# Patient Record
Sex: Female | Born: 1945 | Hispanic: No | Smoking: Never smoker
Health system: Southern US, Community
[De-identification: ages and names within clinical notes are randomized; demographics above are authoritative.]

## PROBLEM LIST (undated history)

## (undated) DIAGNOSIS — F4320 Adjustment disorder, unspecified: Secondary | ICD-10-CM

## (undated) DIAGNOSIS — I10 Essential (primary) hypertension: Secondary | ICD-10-CM

## (undated) DIAGNOSIS — I4891 Unspecified atrial fibrillation: Secondary | ICD-10-CM

## (undated) DIAGNOSIS — H8110 Benign paroxysmal vertigo, unspecified ear: Secondary | ICD-10-CM

## (undated) DIAGNOSIS — R002 Palpitations: Secondary | ICD-10-CM

## (undated) HISTORY — PX: PARTIAL HYSTERECTOMY: SHX80

## (undated) HISTORY — DX: Essential (primary) hypertension: I10

## (undated) HISTORY — DX: Unspecified atrial fibrillation: I48.91

## (undated) HISTORY — DX: Benign paroxysmal vertigo, unspecified ear: H81.10

## (undated) HISTORY — PX: GALLBLADDER SURGERY: SHX652

## (undated) HISTORY — DX: Adjustment disorder, unspecified: F43.20

## (undated) HISTORY — DX: Palpitations: R00.2

## (undated) HISTORY — PX: COLONOSCOPY: SHX174

---

## 2015-02-27 DIAGNOSIS — M810 Age-related osteoporosis without current pathological fracture: Secondary | ICD-10-CM | POA: Insufficient documentation

## 2015-02-27 HISTORY — DX: Age-related osteoporosis without current pathological fracture: M81.0

## 2016-12-13 DIAGNOSIS — R7301 Impaired fasting glucose: Secondary | ICD-10-CM

## 2016-12-13 DIAGNOSIS — E559 Vitamin D deficiency, unspecified: Secondary | ICD-10-CM | POA: Insufficient documentation

## 2016-12-13 DIAGNOSIS — Z79890 Hormone replacement therapy (postmenopausal): Secondary | ICD-10-CM

## 2016-12-13 DIAGNOSIS — M47812 Spondylosis without myelopathy or radiculopathy, cervical region: Secondary | ICD-10-CM

## 2016-12-13 DIAGNOSIS — M47816 Spondylosis without myelopathy or radiculopathy, lumbar region: Secondary | ICD-10-CM | POA: Insufficient documentation

## 2016-12-13 HISTORY — DX: Vitamin D deficiency, unspecified: E55.9

## 2016-12-13 HISTORY — DX: Impaired fasting glucose: R73.01

## 2016-12-13 HISTORY — DX: Spondylosis without myelopathy or radiculopathy, lumbar region: M47.812

## 2016-12-13 HISTORY — DX: Hormone replacement therapy: Z79.890

## 2017-06-03 DIAGNOSIS — N63 Unspecified lump in unspecified breast: Secondary | ICD-10-CM

## 2017-06-03 HISTORY — DX: Unspecified lump in unspecified breast: N63.0

## 2018-02-03 DIAGNOSIS — R030 Elevated blood-pressure reading, without diagnosis of hypertension: Secondary | ICD-10-CM

## 2018-02-03 DIAGNOSIS — R002 Palpitations: Secondary | ICD-10-CM | POA: Insufficient documentation

## 2018-02-03 DIAGNOSIS — I498 Other specified cardiac arrhythmias: Secondary | ICD-10-CM | POA: Insufficient documentation

## 2018-02-03 HISTORY — DX: Elevated blood-pressure reading, without diagnosis of hypertension: R03.0

## 2018-02-03 HISTORY — DX: Other specified cardiac arrhythmias: I49.8

## 2018-02-22 DIAGNOSIS — I4719 Other supraventricular tachycardia: Secondary | ICD-10-CM

## 2018-02-22 DIAGNOSIS — I471 Supraventricular tachycardia: Secondary | ICD-10-CM | POA: Insufficient documentation

## 2018-02-22 HISTORY — DX: Other supraventricular tachycardia: I47.19

## 2019-01-23 DIAGNOSIS — K219 Gastro-esophageal reflux disease without esophagitis: Secondary | ICD-10-CM | POA: Insufficient documentation

## 2019-01-23 HISTORY — DX: Gastro-esophageal reflux disease without esophagitis: K21.9

## 2019-11-19 DIAGNOSIS — I10 Essential (primary) hypertension: Secondary | ICD-10-CM

## 2019-11-19 DIAGNOSIS — R251 Tremor, unspecified: Secondary | ICD-10-CM

## 2019-11-19 HISTORY — DX: Tremor, unspecified: R25.1

## 2019-11-19 HISTORY — DX: Essential (primary) hypertension: I10

## 2020-08-12 NOTE — Progress Notes (Signed)
Cardiology Office Note:    Date:  08/13/2020   ID:  Debra Mendez, DOB [birth year], MRN [id number]  PCP:  [place], Sarah T, MD  Cardiologist:  Norman Herrlich, MD   Referring MD: [place], Sarah T, MD  ASSESSMENT:    1. Chest pain of uncertain etiology   2. Paroxysmal atrial tachycardia (HCC)   3. Atrial premature beats   4. Essential hypertension   5. Shortened PR interval    PLAN:    In order of problems listed above:  1. Her chest pain is concerning I would classify it is possibly cardiac and she is at increased cardiovascular risk with age and hypertension.  I am surprised that this is esophageal when she is taking a PPI not clearly related to her meals and different than her typical reflux.  We discussed modalities for evaluation and after reviewing stress modalities versus cardiac CTA will go ahead and schedule an outpatient cardiac CTA to be performed.  He has no dye intolerance or kidney disease.  If high risk markers would benefit from coronary angiography and revascularization.  If calcium score is elevated would benefit from statin therapy 2. Stable she has had no recurrent atrial tachycardia at this time we will hold on suppressant therapy she has not tolerated calcium channel blockers beta-blockers or antiarrhythmic drugs 3. Stable well-controlled with nonrate limiting calcium channel blocker 4. She has accelerated AV nodal conduction without documented SVT or WPW.  Next appointment 6 weeks   Medication Adjustments/Labs and Tests Ordered: Current medicines are reviewed at length with the patient today.  Concerns regarding medicines are outlined above.  Orders Placed This Encounter  Procedures  . CT CORONARY MORPH W/CTA COR W/SCORE W/CA W/CM &/OR WO/CM  . CT CORONARY FRACTIONAL FLOW RESERVE DATA PREP  . CT CORONARY FRACTIONAL FLOW RESERVE FLUID ANALYSIS  . Basic metabolic panel  . EKG 12-Lead   Meds ordered this encounter  Medications  . metoprolol tartrate  (LOPRESSOR) 100 MG tablet    Sig: Take 1 tablet (100 mg total) by mouth once for 1 dose. Take two hours prior to your cardiac CT    Dispense:  1 tablet    Refill:  0     Chief Complaint  Patient presents with  . Tachycardia    History of PAT  She is mostly concerned about the chest discomfort  History of Present Illness:    Debra Mendez is a 74 y.o. female who is being seen today for the evaluation of palpitation at the request of [place], Sarah T, MD.  Chart review: Cerebrovascular duplex performed at [facility] 04/11/2020 showed minimal atherosclerosis in the left carotid bulb otherwise normal no stenosis. Record review primary care physician office note 06/09/2020 relates she has a history of palpitation and paroxysmal atrial tachycardia as well as vertigo and hypertension. An EKG from 01/19/2018 independently reviewed sinus rhythm with frequent APCs. She has been seen at [facility] practice Dr. Berkley Harvey with a history of paroxysmal atrial tachycardia Laboratory studies from 2018 showed a cholesterol 245 triglycerides 123 HDL 93 12/12/2019 potassium 3.5 creatinine 0.72 hemoglobin 12.7 She had an echocardiogram performed 07/09/2019 which showed normal left ventricular size global and regional left ventricular systolic function EF 60 to 65% normal diastolic function and mild concentric LVH.  There is no significant valvular abnormality. She saw Dr. Clydie Braun, EP [facility] 08/07/2018's note relates that she had short runs of atrial tachycardia and frequent APCs trial of diltiazem was not  tolerated and Coreg was not tolerated with weakness and hypotension.  She was treated with low-dose flecainide.  She is seen here today at her request to establish cardiology care She has done better with her arrhythmia very little palpitation not severe sustained and tells me she was intolerant of flecainide with weakness. The past Benadryl provoked her episodes  and she avoids it. He had trouble with elevated blood pressures on amlodipine and is well controlled. She relates that she has been experiencing chest pain a different from her typical reflux.  She takes a PPI especially in the early mornings when she is stressed she gets burning through the chest no radiation to the back or shoulders no shortness of breath it is not pleuritic she is not diaphoretic at times it forces her to stop sit down and rest for relief but is not exertional in nature.  She is concerned about CAD.  I would describe the symptoms as possible cardiac. Is no association of her chest pain with meals. She has no history of congenital rheumatic heart disease.  Past Medical History:  Diagnosis Date  . Adjustment disorder   . Benign paroxysmal vertigo, unspecified ear   . Essential (primary) hypertension   . Palpitations     Past Surgical History:  Procedure Laterality Date  . COLONOSCOPY    . GALLBLADDER SURGERY    . PARTIAL HYSTERECTOMY      Current Medications: Current Meds  Medication Sig  . amLODipine (NORVASC) 5 MG tablet Take 5 mg by mouth daily.  . Cholecalciferol (VITAMIN D3) 50 MCG (2000 UT) TABS Take 1 tablet by mouth daily.  Marland Kitchen estradiol (ESTRACE) 0.5 MG tablet Take 0.5 mg by mouth daily.  Marland Kitchen ibuprofen (ADVIL) 200 MG tablet Take 200 mg by mouth every 8 (eight) hours as needed.  . pantoprazole (PROTONIX) 40 MG tablet Take 40 mg by mouth daily as needed.  . [DISCONTINUED] hydrochlorothiazide (HYDRODIURIL) 12.5 MG tablet Take 12.5 mg by mouth daily.  . [DISCONTINUED] meclizine (ANTIVERT) 25 MG tablet Take 25 mg by mouth every 8 (eight) hours as needed.     Allergies:   Patient has no known allergies.   Social History   Socioeconomic History  . Marital status: Unknown    Spouse name: Not on file  . Number of children: Not on file  . Years of education: Not on file  . Highest education level: Not on file  Occupational History  . Not on file  Tobacco Use    . Smoking status: Never Smoker  . Smokeless tobacco: Never Used  Vaping Use  . Vaping Use: Never used  Substance and Sexual Activity  . Alcohol use: Not Currently  . Drug use: Never  . Sexual activity: Not on file  Other Topics Concern  . Not on file  Social History Narrative  . Not on file   Social Determinants of Health   Financial Resource Strain:   . Difficulty of Paying Living Expenses: Not on file  Food Insecurity:   . Worried About Programme researcher, broadcasting/film/video in the Last Year: Not on file  . Ran Out of Food in the Last Year: Not on file  Transportation Needs:   . Lack of Transportation (Medical): Not on file  . Lack of Transportation (Non-Medical): Not on file  Physical Activity:   . Days of Exercise per Week: Not on file  . Minutes of Exercise per Session: Not on file  Stress:   . Feeling of Stress : Not  on file  Social Connections:   . Frequency of Communication with Friends and Family: Not on file  . Frequency of Social Gatherings with Friends and Family: Not on file  . Attends Religious Services: Not on file  . Active Member of Clubs or Organizations: Not on file  . Attends Banker Meetings: Not on file  . Marital Status: Not on file     Family History: The patient's family history includes Dementia in her mother; Diabetes in her sister; Heart attack in her maternal grandfather; Heart disease in her father; Stroke in her maternal grandmother and mother.  ROS:   ROS Please see the history of present illness.     All other systems reviewed and are negative.  EKGs/Labs/Other Studies Reviewed:    The following studies were reviewed today:   EKG:  EKG is  ordered today.  The ekg ordered today is personally reviewed and demonstrates normal QRS duration with accelerated AV nodal conduction PR interval 102 ms she has no delta wave this is not WPW    Physical Exam:    VS:  BP 136/70   Pulse (!) 59   Ht 5\' 2"  (1.575 m)   Wt 104 lb 6.4 oz (47.4 kg)    SpO2 99%   BMI 19.10 kg/m     Wt Readings from Last 3 Encounters:  08/13/20 104 lb 6.4 oz (47.4 kg)     GEN:  Well nourished, well developed in no acute distress HEENT: Normal NECK: No JVD; No carotid bruits LYMPHATICS: No lymphadenopathy CARDIAC: RRR, no murmurs, rubs, gallops RESPIRATORY:  Clear to auscultation without rales, wheezing or rhonchi  ABDOMEN: Soft, non-tender, non-distended MUSCULOSKELETAL:  No edema; No deformity  SKIN: Warm and dry NEUROLOGIC:  Alert and oriented x 3 PSYCHIATRIC:  Normal affect     Signed, 08/15/20, MD  08/13/2020 4:08 PM    [facility]

## 2020-08-13 ENCOUNTER — Other Ambulatory Visit: Payer: Self-pay

## 2020-08-13 ENCOUNTER — Encounter: Payer: Self-pay | Admitting: Cardiology

## 2020-08-13 ENCOUNTER — Ambulatory Visit: Payer: Medicare HMO | Admitting: Cardiology

## 2020-08-13 VITALS — BP 136/70 | HR 59 | Ht 62.0 in | Wt 104.4 lb

## 2020-08-13 DIAGNOSIS — I471 Supraventricular tachycardia: Secondary | ICD-10-CM

## 2020-08-13 DIAGNOSIS — R079 Chest pain, unspecified: Secondary | ICD-10-CM

## 2020-08-13 DIAGNOSIS — I10 Essential (primary) hypertension: Secondary | ICD-10-CM | POA: Diagnosis not present

## 2020-08-13 DIAGNOSIS — I491 Atrial premature depolarization: Secondary | ICD-10-CM | POA: Diagnosis not present

## 2020-08-13 DIAGNOSIS — R9431 Abnormal electrocardiogram [ECG] [EKG]: Secondary | ICD-10-CM

## 2020-08-13 MED ORDER — METOPROLOL TARTRATE 100 MG PO TABS: 100.0000 mg | ORAL_TABLET | Freq: Once | ORAL | 0 refills | Status: DC

## 2020-08-13 NOTE — Patient Instructions (Addendum)
Medication Instructions:  Your physician recommends that you continue on your current medications as directed. Please refer to the Current Medication list given to you today.  *If you need a refill on your cardiac medications before your next appointment, please call your pharmacy*   Lab Work: None If you have labs (blood work) drawn today and your tests are completely normal, you will receive your results only by: Marland Kitchen MyChart Message (if you have MyChart) OR . A paper copy in the mail If you have any lab test that is abnormal or we need to change your treatment, we will call you to review the results.   Testing/Procedures: Your cardiac CT will be scheduled at the below location:   [facility] [address] [place],  [address] [phone]  If scheduled at [facility], please arrive at the [facility] main entrance of [facility] 30 minutes prior to test start time. Proceed to the [facility] Radiology Department (first floor) to check-in and test prep.   Please follow these instructions carefully (unless otherwise directed):   On the Night Before the Test: . Be sure to Drink plenty of water. . Do not consume any caffeinated/decaffeinated beverages or chocolate 12 hours prior to your test. . Do not take any antihistamines 12 hours prior to your test.   On the Day of the Test: . Drink plenty of water. Do not drink any water within one hour of the test. . Do not eat any food 4 hours prior to the test. . You may take your regular medications prior to the test.  . Take metoprolol (Lopressor) two hours prior to test. . FEMALES- please wear underwire-free bra if available      After the Test: . Drink plenty of water. . After receiving IV contrast, you may experience a mild flushed feeling. This is normal. . On occasion, you may experience a mild rash up to 24 hours after the test. This is not dangerous. If this occurs, you can take Benadryl 25  mg and increase your fluid intake. . If you experience trouble breathing, this can be serious. If it is severe call 911 IMMEDIATELY. If it is mild, please call our office. . If you take any of these medications: Glipizide/Metformin, Avandament, Glucavance, please do not take 48 hours after completing test unless otherwise instructed.   Once we have confirmed authorization from your insurance company, we will call you to set up a date and time for your test. Based on how quickly your insurance processes prior authorizations requests, please allow up to 4 weeks to be contacted for scheduling your Cardiac CT appointment. Be advised that routine Cardiac CT appointments could be scheduled as many as 8 weeks after your provider has ordered it.  For non-scheduling related questions, please contact the cardiac imaging nurse navigator should you have any questions/concerns: Marchia Bond, Cardiac Imaging Nurse Navigator Burley Saver, Interim Cardiac Imaging Nurse [facility] Direct Office Dial: [phone]   For scheduling needs, including cancellations and rescheduling, please call [place], [phone] (temporary number).      Follow-Up: At [facility], you and your health needs are our priority.  As part of our continuing mission to provide you with exceptional heart care, we have created designated Provider Care Teams.  These Care Teams include your primary Cardiologist (physician) and Advanced Practice Providers (APPs -  Physician Assistants and Nurse Practitioners) who all work together to provide you with the care  you need, when you need it.  We recommend signing up for the patient portal called "MyChart".  Sign up information is provided on this After Visit Summary.  MyChart is used to connect with patients for Virtual Visits (Telemedicine).  Patients are able to view lab/test results, encounter notes, upcoming appointments, etc.  Non-urgent messages can be  sent to your provider as well.   To learn more about what you can do with MyChart, go to [email or web address].    Your next appointment:   6 week(s)  The format for your next appointment:   In Person  Provider:   Shirlee More, MD   Other Instructions

## 2020-09-09 ENCOUNTER — Telehealth: Payer: Self-pay | Admitting: Cardiology

## 2020-09-09 NOTE — Telephone Encounter (Signed)
New Message  Pt would like to have her CT done at the [facility] [place] because she works there   Please advise

## 2020-09-09 NOTE — Telephone Encounter (Signed)
Spoke with pt, aware the cardiac CT scan is only done at [facility].

## 2020-09-11 ENCOUNTER — Other Ambulatory Visit: Payer: Self-pay

## 2020-09-11 DIAGNOSIS — R079 Chest pain, unspecified: Secondary | ICD-10-CM

## 2020-09-12 LAB — BASIC METABOLIC PANEL
BUN/Creatinine Ratio: 16 (ref 12–28)
BUN: 12 mg/dL (ref 8–27)
CO2: 23 mmol/L (ref 20–29)
Calcium: 9.6 mg/dL (ref 8.7–10.3)
Chloride: 101 mmol/L (ref 96–106)
Creatinine, Ser: 0.74 mg/dL (ref 0.57–1.00)
GFR calc Af Amer: 93 mL/min/{1.73_m2} (ref >59)
GFR calc non Af Amer: 81 mL/min/{1.73_m2} (ref >59)
Glucose: 114 mg/dL — ABNORMAL HIGH (ref 65–99)
Potassium: 4 mmol/L (ref 3.5–5.2)
Sodium: 138 mmol/L (ref 134–144)

## 2020-09-15 ENCOUNTER — Telehealth: Payer: Self-pay

## 2020-09-15 NOTE — Telephone Encounter (Signed)
Spoke with patient regarding results and recommendation.  Patient verbalizes understanding and is agreeable to plan of care. Advised patient to call back with any issues or concerns.  

## 2020-09-18 ENCOUNTER — Telehealth (HOSPITAL_COMMUNITY): Payer: Self-pay | Admitting: Emergency Medicine

## 2020-09-18 NOTE — Telephone Encounter (Signed)
Attempted to call patient regarding upcoming cardiac CT appointment. °Left message on voicemail with name and callback number °Traniya Prichett RN Navigator Cardiac Imaging °[facility] °[phone] Office °[phone] Cell ° °

## 2020-09-18 NOTE — Telephone Encounter (Signed)
Pt returning phone call regarding upcoming cardiac imaging study; pt verbalizes understanding of appt date/time, parking situation and where to check in, pre-test NPO status and medications ordered, and verified current allergies; name and call back number provided for further questions should they arise Eleana Tocco RN Navigator Cardiac Imaging [facility] [phone] office [phone] cell   

## 2020-09-22 ENCOUNTER — Ambulatory Visit (HOSPITAL_COMMUNITY): Payer: Medicare HMO

## 2020-09-23 DIAGNOSIS — H40033 Anatomical narrow angle, bilateral: Secondary | ICD-10-CM | POA: Diagnosis not present

## 2020-09-23 DIAGNOSIS — H2512 Age-related nuclear cataract, left eye: Secondary | ICD-10-CM | POA: Diagnosis not present

## 2020-09-23 DIAGNOSIS — Z01818 Encounter for other preprocedural examination: Secondary | ICD-10-CM | POA: Diagnosis not present

## 2020-09-29 ENCOUNTER — Telehealth (HOSPITAL_COMMUNITY): Payer: Self-pay | Admitting: Emergency Medicine

## 2020-09-29 NOTE — Telephone Encounter (Signed)
Reaching out to patient to offer assistance regarding upcoming cardiac imaging study; pt verbalizes understanding of appt date/time, parking situation and where to check in, pre-test NPO status and medications ordered, and verified current allergies; name and call back number provided for further questions should they arise Rockwell Alexandria RN Navigator Cardiac Imaging [facility] [phone] office [phone] cell  Pt to take 50mg  metop 2 hr prior to scan 

## 2020-10-01 ENCOUNTER — Ambulatory Visit (HOSPITAL_COMMUNITY)
Admission: RE | Admit: 2020-10-01 | Discharge: 2020-10-01 | Disposition: A | Discharge status: Home / Self Care | Payer: Medicare HMO | Source: Ambulatory Visit | Attending: Cardiology | Admitting: Cardiology

## 2020-10-01 ENCOUNTER — Encounter: Payer: Medicare HMO | Admitting: *Deleted

## 2020-10-01 ENCOUNTER — Telehealth: Payer: Self-pay | Admitting: Cardiology

## 2020-10-01 ENCOUNTER — Other Ambulatory Visit: Payer: Self-pay

## 2020-10-01 ENCOUNTER — Encounter (HOSPITAL_COMMUNITY): Payer: Self-pay

## 2020-10-01 DIAGNOSIS — I700 Atherosclerosis of aorta: Secondary | ICD-10-CM | POA: Insufficient documentation

## 2020-10-01 DIAGNOSIS — Q211 Atrial septal defect: Secondary | ICD-10-CM | POA: Diagnosis not present

## 2020-10-01 DIAGNOSIS — Z006 Encounter for examination for normal comparison and control in clinical research program: Secondary | ICD-10-CM

## 2020-10-01 DIAGNOSIS — R079 Chest pain, unspecified: Secondary | ICD-10-CM

## 2020-10-01 DIAGNOSIS — I2510 Atherosclerotic heart disease of native coronary artery without angina pectoris: Secondary | ICD-10-CM | POA: Diagnosis not present

## 2020-10-01 DIAGNOSIS — I471 Supraventricular tachycardia: Secondary | ICD-10-CM

## 2020-10-01 MED ORDER — NITROGLYCERIN 0.4 MG SL SUBL
0.8000 mg | SUBLINGUAL_TABLET | Freq: Once | SUBLINGUAL | Status: AC
  Administered 2020-10-01: 0.8 mg via SUBLINGUAL

## 2020-10-01 MED ORDER — NITROGLYCERIN 0.4 MG SL SUBL
SUBLINGUAL_TABLET | SUBLINGUAL | Status: AC
  Filled 2020-10-01: qty 2

## 2020-10-01 MED ORDER — IOHEXOL 350 MG/ML SOLN
80.0000 mL | Freq: Once | INTRAVENOUS | Status: AC | PRN
  Administered 2020-10-01: 80 mL via INTRAVENOUS

## 2020-10-01 NOTE — Research (Signed)
CADFEM G4 Informed Consent   Subject Name: Debra Mendez  Subject met inclusion and exclusion criteria.  The informed consent form, study requirements and expectations were reviewed with the subject and questions and concerns were addressed prior to the signing of the consent form.  The subject verbalized understanding of the trial requirements.  The subject agreed to participate in the CADFEM G4 trial and signed the informed consent at 10:30 on 10/01/2020.  The informed consent was obtained prior to performance of any protocol-specific procedures for the subject.  A copy of the signed informed consent was given to the subject and a copy was placed in the subject's medical record.   Philemon Kingdom D

## 2020-10-01 NOTE — Telephone Encounter (Signed)
Patient is calling back for results. Please call back

## 2020-10-01 NOTE — Telephone Encounter (Signed)
Called patient informed her of results.  

## 2020-10-06 ENCOUNTER — Ambulatory Visit: Payer: Medicare HMO | Admitting: Cardiology

## 2020-10-14 DIAGNOSIS — H2512 Age-related nuclear cataract, left eye: Secondary | ICD-10-CM | POA: Diagnosis not present

## 2020-10-14 DIAGNOSIS — H259 Unspecified age-related cataract: Secondary | ICD-10-CM | POA: Diagnosis not present

## 2020-10-14 DIAGNOSIS — I10 Essential (primary) hypertension: Secondary | ICD-10-CM | POA: Diagnosis not present

## 2020-10-14 DIAGNOSIS — K219 Gastro-esophageal reflux disease without esophagitis: Secondary | ICD-10-CM | POA: Diagnosis not present

## 2020-10-14 DIAGNOSIS — H52203 Unspecified astigmatism, bilateral: Secondary | ICD-10-CM | POA: Diagnosis not present

## 2020-10-14 DIAGNOSIS — H35371 Puckering of macula, right eye: Secondary | ICD-10-CM | POA: Diagnosis not present

## 2020-10-14 DIAGNOSIS — Z79899 Other long term (current) drug therapy: Secondary | ICD-10-CM | POA: Diagnosis not present

## 2020-10-14 DIAGNOSIS — H40033 Anatomical narrow angle, bilateral: Secondary | ICD-10-CM | POA: Diagnosis not present

## 2020-11-03 ENCOUNTER — Ambulatory Visit: Payer: Medicare HMO | Admitting: Cardiology

## 2020-12-09 DIAGNOSIS — R197 Diarrhea, unspecified: Secondary | ICD-10-CM | POA: Diagnosis not present

## 2020-12-09 DIAGNOSIS — R42 Dizziness and giddiness: Secondary | ICD-10-CM | POA: Diagnosis not present

## 2020-12-11 DIAGNOSIS — R197 Diarrhea, unspecified: Secondary | ICD-10-CM | POA: Diagnosis not present

## 2020-12-24 NOTE — Progress Notes (Signed)
Cardiology Office Note:    Date:  12/25/2020   ID:  Debra Mendez, DOB January 07, 1946, MRN [id number]  PCP:  [place], Sarah T, MD  Cardiologist:  Norman Herrlich, MD    Referring MD: [place], Sarah T, MD    ASSESSMENT:    1. Chest pain of uncertain etiology   2. Paroxysmal atrial tachycardia (HCC)   3. Essential hypertension    PLAN:    In order of problems listed above:  1. Best description of her cardiac CTA is minimally abnormal very near normal.  Calcium score of 0 in a nondiabetic puts her extremely low risk for the next 5 to 7 years of cardiovascular events and makes lipid-lowering therapy optional.  Her patent foramen ovale is not a clinical concern.  I have offered her reassurance. 2. Stable at target on current treatment   Next appointment: As needed   Medication Adjustments/Labs and Tests Ordered: Current medicines are reviewed at length with the patient today.  Concerns regarding medicines are outlined above.  No orders of the defined types were placed in this encounter.  No orders of the defined types were placed in this encounter.   No chief complaint on file.   History of Present Illness:    Debra Mendez is a 75 y.o. female with a hx of  paroxysmal atrial tachycardia hypertension and a short PR interval without preexcitation.  Last seen 24 2021 Compliance with diet, lifestyle and medications: Yes  Her primary complaint is abdominal discomfort and watery and frequent diarrhea.  It makes her feel weak.  No complaints of chest pain shortness of breath palpitation or syncope.  She had cardiac CTA reported out 10/01/2020 with a calcium score of 0 and minimal nonobstructive plaque less than 25% noncalcified noted in the LAD and left circumflex coronary artery.  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 Adjustment disorder   . Benign paroxysmal vertigo, unspecified ear   . Essential  (primary) hypertension   . Palpitations     Past Surgical History:  Procedure Laterality Date  . COLONOSCOPY    . GALLBLADDER SURGERY    . PARTIAL HYSTERECTOMY      Current Medications: Current Meds  Medication Sig  . Cholecalciferol (VITAMIN D3) 50 MCG (2000 UT) TABS Take 1 tablet by mouth daily.  Marland Kitchen estradiol (ESTRACE) 0.5 MG tablet Take 0.5 mg by mouth daily.  Marland Kitchen ibuprofen (ADVIL) 200 MG tablet Take 200 mg by mouth every 8 (eight) hours as needed.  . pantoprazole (PROTONIX) 40 MG tablet Take 40 mg by mouth daily as needed.  . [DISCONTINUED] amLODipine (NORVASC) 5 MG tablet Take 5 mg by mouth daily.     Allergies:   Patient has no known allergies.   Social History   Socioeconomic History  . Marital status: Unknown    Spouse name: Not on file  . Number of children: Not on file  . Years of education: Not on file  . Highest education level: Not on file  Occupational History  . Not on file  Tobacco Use  . Smoking status: Never Smoker  . Smokeless tobacco: Never Used  Vaping Use  . Vaping Use: Never used  Substance and Sexual Activity  . Alcohol use: Not Currently  . Drug use: Never  . Sexual activity: Not on file  Other Topics Concern  . Not on file  Social History Narrative  . Not on file  Social Determinants of Health   Financial Resource Strain: Not on file  Food Insecurity: Not on file  Transportation Needs: Not on file  Physical Activity: Not on file  Stress: Not on file  Social Connections: Not on file     Family History: The patient's family history includes Dementia in her mother; Diabetes in her sister; Heart attack in her maternal grandfather; Heart disease in her father; Stroke in her maternal grandmother and mother. ROS:   Please see the history of present illness.    All other systems reviewed and are negative.  EKGs/Labs/Other Studies Reviewed:    The following studies were reviewed today:    Recent Labs: 09/11/2020: BUN 12; Creatinine,  Ser 0.74; Potassium 4.0; Sodium 138  Recent Lipid Panel No results found for: CHOL, TRIG, HDL, CHOLHDL, VLDL, LDLCALC, LDLDIRECT  Physical Exam:    VS:  BP (!) 114/54 (BP Location: Left Arm, Patient Position: Sitting)   Pulse 72   Ht 5\' 2"  (1.575 m)   Wt 104 lb 9.6 oz (47.4 kg)   SpO2 97%   BMI 19.13 kg/m     Wt Readings from Last 3 Encounters:  12/25/20 104 lb 9.6 oz (47.4 kg)  08/13/20 104 lb 6.4 oz (47.4 kg)     GEN:  Well nourished, well developed in no acute distress HEENT: Normal NECK: No JVD; No carotid bruits LYMPHATICS: No lymphadenopathy CARDIAC: RRR, no murmurs, rubs, gallops RESPIRATORY:  Clear to auscultation without rales, wheezing or rhonchi  ABDOMEN: Soft, non-tender, non-distended MUSCULOSKELETAL:  No edema; No deformity  SKIN: Warm and dry NEUROLOGIC:  Alert and oriented x 3 PSYCHIATRIC:  Normal affect    Signed, 08/15/20, MD  12/25/2020 1:40 PM    [facility]

## 2020-12-25 ENCOUNTER — Other Ambulatory Visit: Payer: Self-pay

## 2020-12-25 ENCOUNTER — Ambulatory Visit: Payer: Medicare HMO | Admitting: Cardiology

## 2020-12-25 ENCOUNTER — Encounter: Payer: Self-pay | Admitting: Cardiology

## 2020-12-25 VITALS — BP 114/54 | HR 72 | Ht 62.0 in | Wt 104.6 lb

## 2020-12-25 DIAGNOSIS — I10 Essential (primary) hypertension: Secondary | ICD-10-CM | POA: Diagnosis not present

## 2020-12-25 DIAGNOSIS — R079 Chest pain, unspecified: Secondary | ICD-10-CM | POA: Diagnosis not present

## 2020-12-25 MED ORDER — AMLODIPINE BESYLATE 5 MG PO TABS: 5.0000 mg | ORAL_TABLET | Freq: Every day | ORAL | 3 refills | Status: DC

## 2020-12-25 NOTE — Patient Instructions (Signed)

## 2020-12-25 NOTE — Telephone Encounter (Signed)
Amlodipine 5 mg tablet # 90 x 3 additional refill sent to pharmacy.

## 2021-02-02 DIAGNOSIS — K581 Irritable bowel syndrome with constipation: Secondary | ICD-10-CM | POA: Diagnosis not present

## 2021-02-10 ENCOUNTER — Telehealth: Payer: Self-pay

## 2021-02-10 DIAGNOSIS — Z006 Encounter for examination for normal comparison and control in clinical research program: Secondary | ICD-10-CM

## 2021-02-10 NOTE — Telephone Encounter (Signed)
I called patient for her 90-day Identify Study follow up phone call. Patient is doing well with no cardiac symptoms at this time. I reminded patient I would call her in January for her 1 year follow-up. 

## 2021-03-18 DIAGNOSIS — K581 Irritable bowel syndrome with constipation: Secondary | ICD-10-CM | POA: Diagnosis not present

## 2021-03-18 DIAGNOSIS — K5900 Constipation, unspecified: Secondary | ICD-10-CM | POA: Diagnosis not present

## 2021-03-18 DIAGNOSIS — K5730 Diverticulosis of large intestine without perforation or abscess without bleeding: Secondary | ICD-10-CM | POA: Diagnosis not present

## 2021-03-19 DIAGNOSIS — H6983 Other specified disorders of Eustachian tube, bilateral: Secondary | ICD-10-CM | POA: Diagnosis not present

## 2021-03-19 DIAGNOSIS — R197 Diarrhea, unspecified: Secondary | ICD-10-CM | POA: Diagnosis not present

## 2021-03-19 DIAGNOSIS — I10 Essential (primary) hypertension: Secondary | ICD-10-CM | POA: Diagnosis not present

## 2021-03-19 DIAGNOSIS — E559 Vitamin D deficiency, unspecified: Secondary | ICD-10-CM | POA: Diagnosis not present

## 2021-03-19 DIAGNOSIS — R222 Localized swelling, mass and lump, trunk: Secondary | ICD-10-CM | POA: Diagnosis not present

## 2021-03-24 DIAGNOSIS — Z13220 Encounter for screening for lipoid disorders: Secondary | ICD-10-CM | POA: Diagnosis not present

## 2021-03-24 DIAGNOSIS — E559 Vitamin D deficiency, unspecified: Secondary | ICD-10-CM | POA: Diagnosis not present

## 2021-03-24 DIAGNOSIS — I10 Essential (primary) hypertension: Secondary | ICD-10-CM | POA: Diagnosis not present

## 2021-04-21 DIAGNOSIS — I10 Essential (primary) hypertension: Secondary | ICD-10-CM | POA: Diagnosis not present

## 2021-04-21 DIAGNOSIS — D210 Benign neoplasm of connective and other soft tissue of head, face and neck: Secondary | ICD-10-CM | POA: Diagnosis not present

## 2021-05-13 DIAGNOSIS — Z780 Asymptomatic menopausal state: Secondary | ICD-10-CM | POA: Diagnosis not present

## 2021-05-13 DIAGNOSIS — Z1151 Encounter for screening for human papillomavirus (HPV): Secondary | ICD-10-CM | POA: Diagnosis not present

## 2021-05-13 DIAGNOSIS — Z01419 Encounter for gynecological examination (general) (routine) without abnormal findings: Secondary | ICD-10-CM | POA: Diagnosis not present

## 2021-05-13 DIAGNOSIS — M810 Age-related osteoporosis without current pathological fracture: Secondary | ICD-10-CM | POA: Diagnosis not present

## 2021-05-13 DIAGNOSIS — I10 Essential (primary) hypertension: Secondary | ICD-10-CM | POA: Diagnosis not present

## 2021-05-13 DIAGNOSIS — Z1272 Encounter for screening for malignant neoplasm of vagina: Secondary | ICD-10-CM | POA: Diagnosis not present

## 2021-05-13 DIAGNOSIS — Z79890 Hormone replacement therapy (postmenopausal): Secondary | ICD-10-CM | POA: Diagnosis not present

## 2021-05-13 DIAGNOSIS — Z90710 Acquired absence of both cervix and uterus: Secondary | ICD-10-CM | POA: Diagnosis not present

## 2021-06-09 DIAGNOSIS — H2511 Age-related nuclear cataract, right eye: Secondary | ICD-10-CM | POA: Diagnosis not present

## 2021-06-09 DIAGNOSIS — Z01818 Encounter for other preprocedural examination: Secondary | ICD-10-CM | POA: Diagnosis not present

## 2021-06-16 DIAGNOSIS — H259 Unspecified age-related cataract: Secondary | ICD-10-CM | POA: Diagnosis not present

## 2021-06-16 DIAGNOSIS — H2511 Age-related nuclear cataract, right eye: Secondary | ICD-10-CM | POA: Diagnosis not present

## 2021-06-16 DIAGNOSIS — H52203 Unspecified astigmatism, bilateral: Secondary | ICD-10-CM | POA: Diagnosis not present

## 2021-06-16 DIAGNOSIS — H25811 Combined forms of age-related cataract, right eye: Secondary | ICD-10-CM | POA: Diagnosis not present

## 2021-07-20 DIAGNOSIS — Z1231 Encounter for screening mammogram for malignant neoplasm of breast: Secondary | ICD-10-CM | POA: Diagnosis not present

## 2021-08-17 DIAGNOSIS — Z0100 Encounter for examination of eyes and vision without abnormal findings: Secondary | ICD-10-CM | POA: Diagnosis not present

## 2021-10-29 DIAGNOSIS — M810 Age-related osteoporosis without current pathological fracture: Secondary | ICD-10-CM | POA: Diagnosis not present

## 2021-10-29 DIAGNOSIS — M8589 Other specified disorders of bone density and structure, multiple sites: Secondary | ICD-10-CM | POA: Diagnosis not present

## 2021-10-29 DIAGNOSIS — Z79890 Hormone replacement therapy (postmenopausal): Secondary | ICD-10-CM | POA: Diagnosis not present

## 2021-12-28 ENCOUNTER — Other Ambulatory Visit: Payer: Self-pay | Admitting: Cardiology

## 2022-02-22 DIAGNOSIS — H81319 Aural vertigo, unspecified ear: Secondary | ICD-10-CM | POA: Diagnosis not present

## 2022-02-22 DIAGNOSIS — Z681 Body mass index (BMI) 19 or less, adult: Secondary | ICD-10-CM | POA: Diagnosis not present

## 2022-02-22 DIAGNOSIS — K219 Gastro-esophageal reflux disease without esophagitis: Secondary | ICD-10-CM | POA: Diagnosis not present

## 2022-02-22 DIAGNOSIS — Z1331 Encounter for screening for depression: Secondary | ICD-10-CM | POA: Diagnosis not present

## 2022-02-22 DIAGNOSIS — H6983 Other specified disorders of Eustachian tube, bilateral: Secondary | ICD-10-CM | POA: Diagnosis not present

## 2022-02-22 DIAGNOSIS — M2550 Pain in unspecified joint: Secondary | ICD-10-CM | POA: Diagnosis not present

## 2022-02-22 DIAGNOSIS — M8588 Other specified disorders of bone density and structure, other site: Secondary | ICD-10-CM | POA: Diagnosis not present

## 2022-02-22 DIAGNOSIS — I480 Paroxysmal atrial fibrillation: Secondary | ICD-10-CM | POA: Diagnosis not present

## 2022-02-22 DIAGNOSIS — Z7189 Other specified counseling: Secondary | ICD-10-CM | POA: Diagnosis not present

## 2022-02-22 DIAGNOSIS — I10 Essential (primary) hypertension: Secondary | ICD-10-CM | POA: Diagnosis not present

## 2022-02-22 DIAGNOSIS — M85852 Other specified disorders of bone density and structure, left thigh: Secondary | ICD-10-CM | POA: Diagnosis not present

## 2022-05-03 DIAGNOSIS — H8110 Benign paroxysmal vertigo, unspecified ear: Secondary | ICD-10-CM | POA: Diagnosis not present

## 2022-05-03 DIAGNOSIS — R42 Dizziness and giddiness: Secondary | ICD-10-CM | POA: Diagnosis not present

## 2022-05-03 DIAGNOSIS — H903 Sensorineural hearing loss, bilateral: Secondary | ICD-10-CM | POA: Diagnosis not present

## 2022-08-11 DIAGNOSIS — R059 Cough, unspecified: Secondary | ICD-10-CM | POA: Diagnosis not present

## 2022-08-11 DIAGNOSIS — J019 Acute sinusitis, unspecified: Secondary | ICD-10-CM | POA: Diagnosis not present

## 2022-08-11 DIAGNOSIS — J209 Acute bronchitis, unspecified: Secondary | ICD-10-CM | POA: Diagnosis not present

## 2022-08-16 DIAGNOSIS — J189 Pneumonia, unspecified organism: Secondary | ICD-10-CM | POA: Diagnosis not present

## 2022-08-16 DIAGNOSIS — H6992 Unspecified Eustachian tube disorder, left ear: Secondary | ICD-10-CM | POA: Diagnosis not present

## 2022-08-16 DIAGNOSIS — H81319 Aural vertigo, unspecified ear: Secondary | ICD-10-CM | POA: Diagnosis not present

## 2022-08-16 DIAGNOSIS — Z681 Body mass index (BMI) 19 or less, adult: Secondary | ICD-10-CM | POA: Diagnosis not present

## 2022-08-22 IMAGING — CT CT HEART MORP W/ CTA COR W/ SCORE W/ CA W/CM &/OR W/O CM
1 series · 13 of 18 positions shown, 17 images · non-contrast
Comparison: None.
COMPARISON: None.

Addendum:
EXAM:
OVER-READ INTERPRETATION  CT CHEST

The following report is an over-read performed by radiologist Dr.
Morena Da Costa Serfaty [REDACTED] on 10/01/2020. This
over-read does not include interpretation of cardiac or coronary
anatomy or pathology. The coronary calcium score/coronary CTA
interpretation by the cardiologist is attached.
CLINICAL DATA: Chest pain
Cardiac/Coronary CTA
TECHNIQUE: The patient was scanned on a [employer] scanner. A 110 kV
prospective scan was triggered in the descending thoracic aorta at
111 HU's. Axial non-contrast 3 mm slices were carried out through
the heart. The data set was analyzed on a dedicated work station and
scored using the Agatson method. Gantry rotation speed was 250 msecs
and collimation was .6 mm. No beta blockade and 0.8 mg of sl NTG was
given. The 3D data set was reconstructed in 5% intervals of the
35-75 % of the R-R cycle. Diastolic phases were analyzed on a
dedicated work station using MPR, MIP and VRT modes. The patient
received 80 cc of contrast.

[Series 659: findings · 13 of 18 slices shown, 17 images]
[im 2/18  vessel]
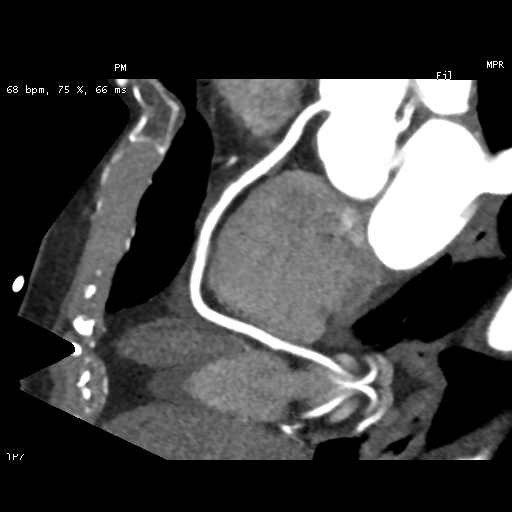
[im 2/18  lung]
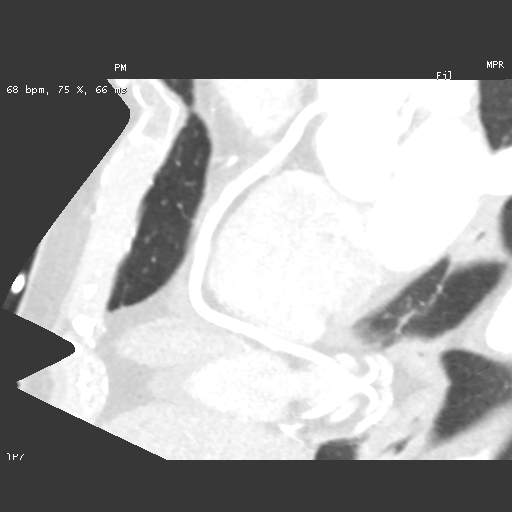
[im 3/18  vessel]
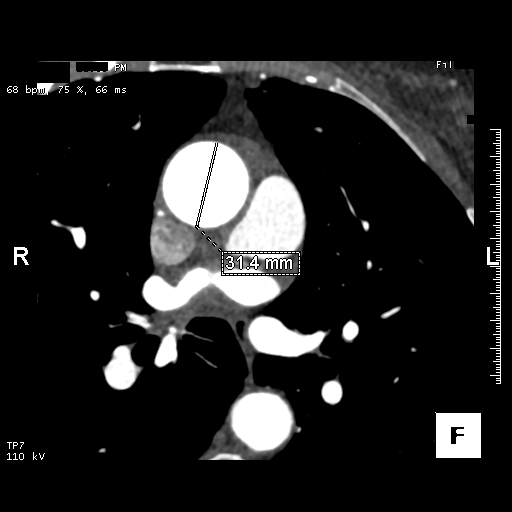
[im 5/18  vessel]
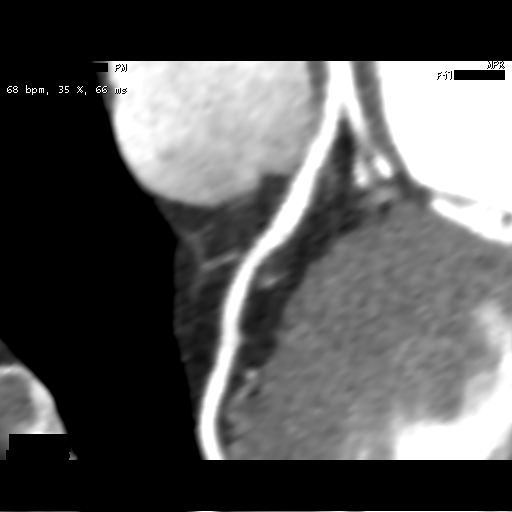
[im 6/18  vessel]
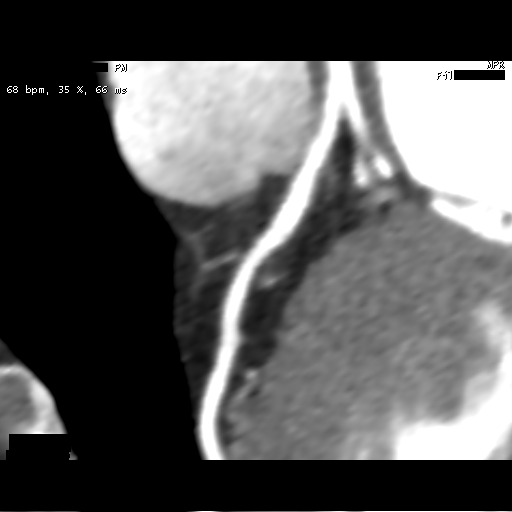
[im 7/18  vessel]
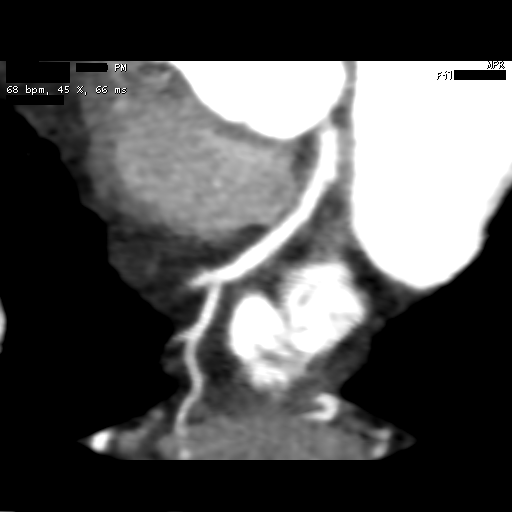
[im 7/18  lung]
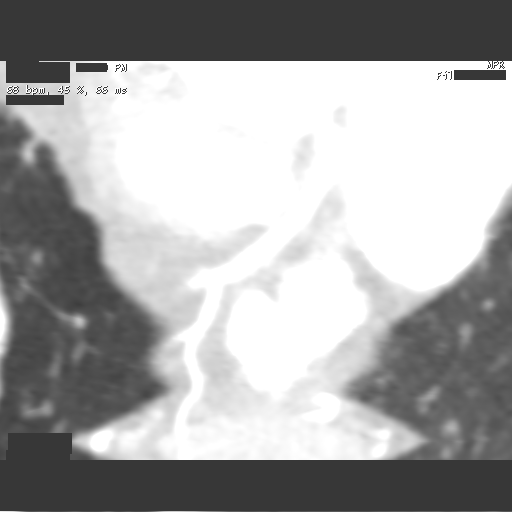
[im 8/18  vessel]
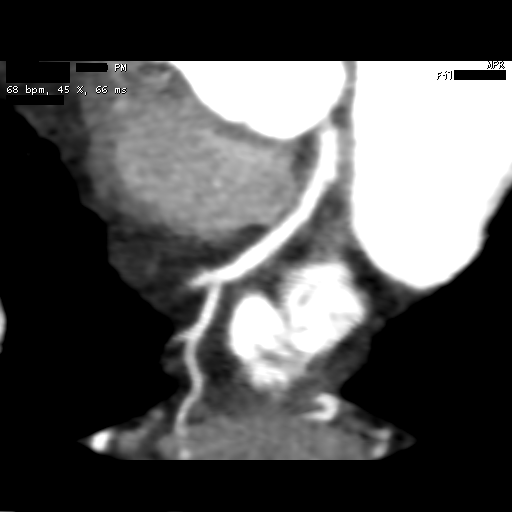
[im 10/18  vessel]
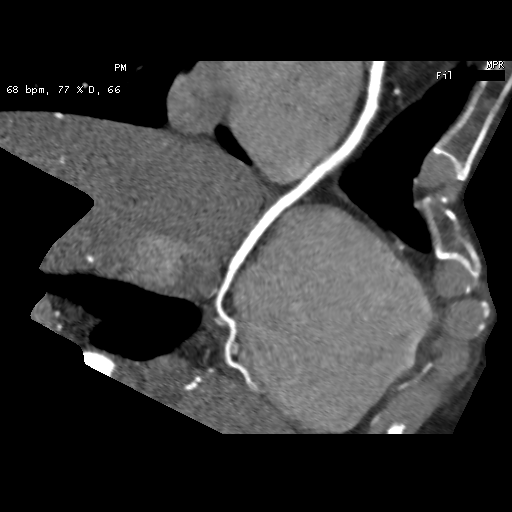
[im 11/18  vessel]
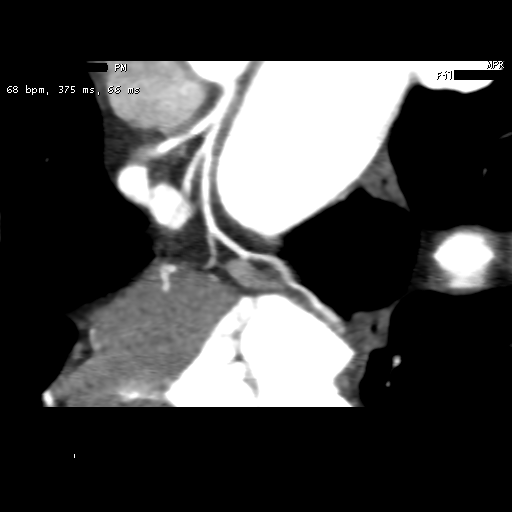
[im 12/18  vessel]
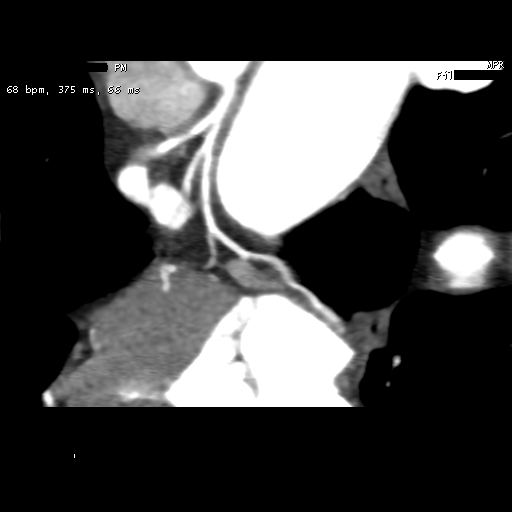
[im 12/18  lung]
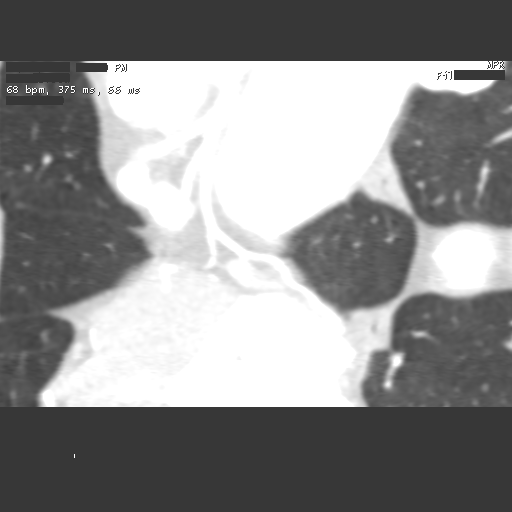
[im 13/18  vessel]
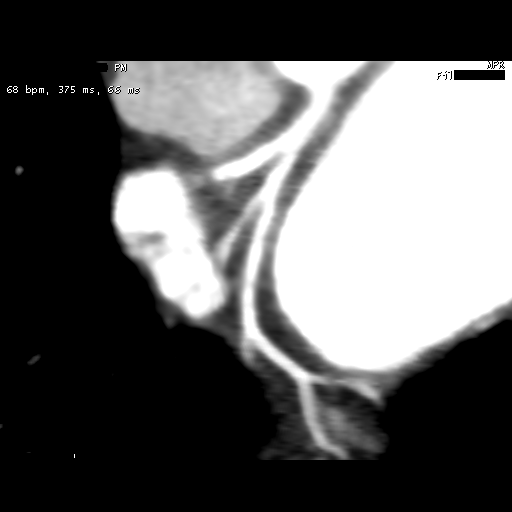
[im 14/18  vessel]
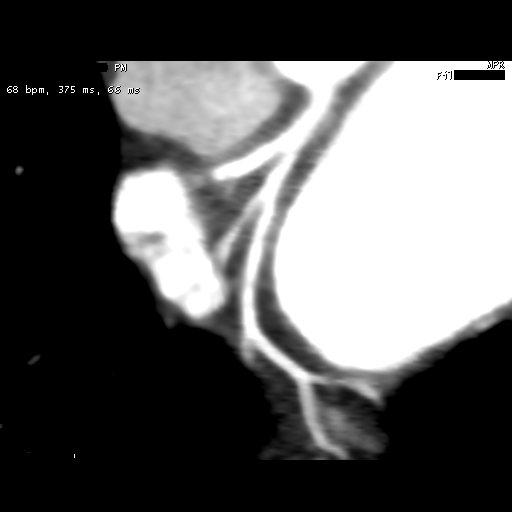
[im 16/18  vessel]
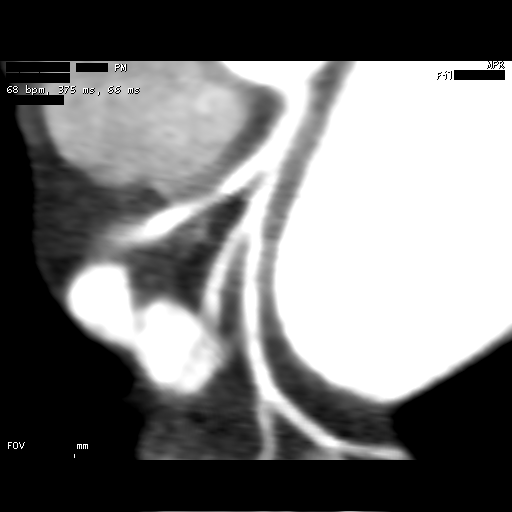
[im 17/18  vessel]
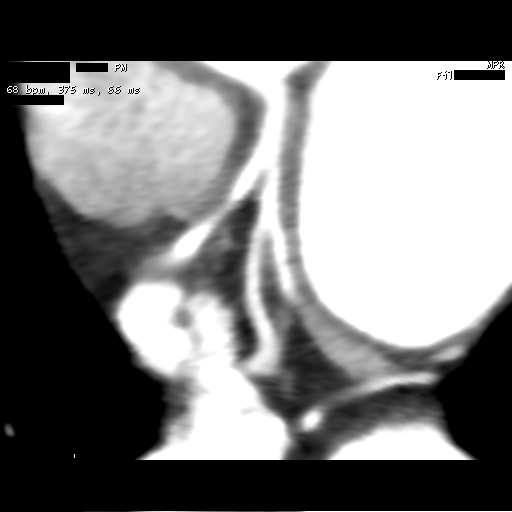
[im 17/18  lung]
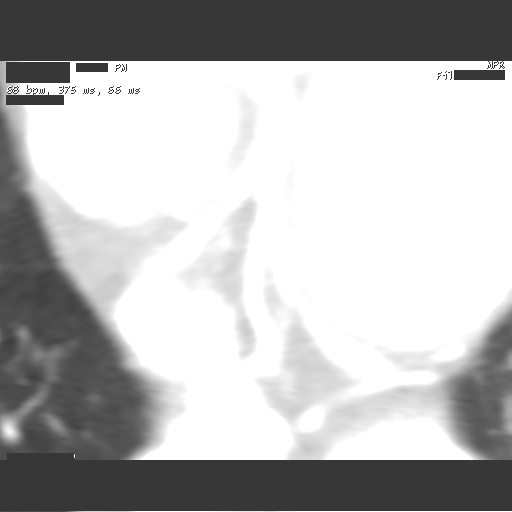

[13 of 18 positions shown; findings below may reference images not displayed]

FINDINGS: Aortic atherosclerosis. Within the visualized portions of the thorax
there are no suspicious appearing pulmonary nodules or masses, there
is no acute consolidative airspace disease, no pleural effusions, no
pneumothorax and no lymphadenopathy. Visualized portions of the
upper abdomen are unremarkable. There are no aggressive appearing
lytic or blastic lesions noted in the visualized portions of the
skeleton.
IMPRESSION: 1.  Aortic Atherosclerosis (WYHZI-UX3.3).
FINDINGS: Image quality: excellent.

Noise artifact is: Limited.  Limited cardiac motion artifact.

Coronary Arteries:  Normal coronary origin.  Right dominance.

Left main: The left main is a large caliber vessel with a normal
take off from the left coronary cusp that bifurcates to form a left
anterior descending artery and a left circumflex artery. There is no
plaque or stenosis.

Left anterior descending artery: The LAD is patent with minimal
non-calcified plaque (<25%). The LAD gives off 1 patent diagonal
branch.

Left circumflex artery: The LCX is non-dominant and patent with
minimal non-calcified plaque (<25%). The LCX gives off 3 patent
obtuse marginal branches.

Right coronary artery: The RCA is dominant with normal take off from
the right coronary cusp. There is minimal non-calcified plaque
(<25%). The RCA terminates as a PDA and right posterolateral branch
without evidence of plaque or stenosis.

Right Atrium: Right atrial size is within normal limits.

Right Ventricle: The right ventricular cavity is within normal
limits.

Left Atrium: Left atrial size is normal in size with no left atrial
appendage filling defect. A small PFO is present.

Left Ventricle: The ventricular cavity size is within normal limits.
There are no stigmata of prior infarction. There is no abnormal
filling defect.

Pulmonary arteries: Normal in size without proximal filling defect.

Pulmonary veins: Normal pulmonary venous drainage.

Pericardium: Normal thickness with no significant effusion or
calcium present.

Cardiac valves: The aortic valve is trileaflet without significant
calcification. The mitral valve is normal structure without
significant calcification.

Aorta: Normal caliber with no significant disease.

Extra-cardiac findings: See attached radiology report for
non-cardiac structures.
IMPRESSION: 1. Coronary calcium score of 0.

2. Normal coronary origin with right dominance.

3. Minimal, non-calcified CAD in the LAD/LCX/RCA (<25%).

4. Small PFO.

RECOMMENDATIONS:
1. Minimal non-obstructive CAD (0-24%). Consider non-atherosclerotic
causes of chest pain. Consider preventive therapy and risk factor
modification.

*** End of Addendum ***
EXAM:
OVER-READ INTERPRETATION  CT CHEST

The following report is an over-read performed by radiologist Dr.
Morena Da Costa Serfaty [REDACTED] on 10/01/2020. This
over-read does not include interpretation of cardiac or coronary
anatomy or pathology. The coronary calcium score/coronary CTA
interpretation by the cardiologist is attached.
FINDINGS: Aortic atherosclerosis. Within the visualized portions of the thorax
there are no suspicious appearing pulmonary nodules or masses, there
is no acute consolidative airspace disease, no pleural effusions, no
pneumothorax and no lymphadenopathy. Visualized portions of the
upper abdomen are unremarkable. There are no aggressive appearing
lytic or blastic lesions noted in the visualized portions of the
skeleton.
IMPRESSION: 1.  Aortic Atherosclerosis (WYHZI-UX3.3).

## 2022-09-08 DIAGNOSIS — Z681 Body mass index (BMI) 19 or less, adult: Secondary | ICD-10-CM | POA: Diagnosis not present

## 2022-09-08 DIAGNOSIS — I480 Paroxysmal atrial fibrillation: Secondary | ICD-10-CM | POA: Diagnosis not present

## 2022-09-08 DIAGNOSIS — J189 Pneumonia, unspecified organism: Secondary | ICD-10-CM | POA: Diagnosis not present

## 2022-12-02 DIAGNOSIS — Z1231 Encounter for screening mammogram for malignant neoplasm of breast: Secondary | ICD-10-CM | POA: Diagnosis not present

## 2023-01-03 DIAGNOSIS — Z0100 Encounter for examination of eyes and vision without abnormal findings: Secondary | ICD-10-CM | POA: Diagnosis not present

## 2023-01-13 DIAGNOSIS — N3000 Acute cystitis without hematuria: Secondary | ICD-10-CM | POA: Diagnosis not present

## 2023-03-14 DIAGNOSIS — R6889 Other general symptoms and signs: Secondary | ICD-10-CM | POA: Diagnosis not present

## 2023-03-22 ENCOUNTER — Encounter: Payer: Self-pay | Admitting: Cardiology

## 2023-04-01 DIAGNOSIS — Z1211 Encounter for screening for malignant neoplasm of colon: Secondary | ICD-10-CM | POA: Diagnosis not present

## 2023-04-01 DIAGNOSIS — Z1212 Encounter for screening for malignant neoplasm of rectum: Secondary | ICD-10-CM | POA: Diagnosis not present

## 2023-04-05 ENCOUNTER — Other Ambulatory Visit: Payer: Self-pay

## 2023-04-05 DIAGNOSIS — I480 Paroxysmal atrial fibrillation: Secondary | ICD-10-CM | POA: Diagnosis not present

## 2023-04-06 ENCOUNTER — Other Ambulatory Visit: Payer: Self-pay

## 2023-04-06 DIAGNOSIS — I4891 Unspecified atrial fibrillation: Secondary | ICD-10-CM | POA: Insufficient documentation

## 2023-04-06 DIAGNOSIS — I10 Essential (primary) hypertension: Secondary | ICD-10-CM | POA: Insufficient documentation

## 2023-04-06 DIAGNOSIS — F4320 Adjustment disorder, unspecified: Secondary | ICD-10-CM | POA: Insufficient documentation

## 2023-04-06 DIAGNOSIS — H8110 Benign paroxysmal vertigo, unspecified ear: Secondary | ICD-10-CM | POA: Insufficient documentation

## 2023-04-10 NOTE — Progress Notes (Unsigned)
Cardiology Office Note:    Date:  04/11/2023   ID:  Debra Mendez, DOB Apr 10, 1946, MRN [id number]  PCP:  [place], Sarah T, MD  Cardiologist:  Norman Herrlich, MD    Referring MD: Buckner Malta, MD    ASSESSMENT:    1. Ectopic atrial tachycardia   2. Essential hypertension   3. PVCs (premature ventricular contractions)    PLAN:    In order of problems listed above:  Fortunately she has ectopic atrial tachycardia or paroxysmal atrial tachycardia not atrial fibrillation or flutter She has not need to initiate anticoagulation Typically a low-dose beta-blocker is remarkably effective we will start Toprol-XL I encouraged her to get a smart watch to self monitor for recurrence she can send strips through MyChart Blood pressure well-controlled continue her calcium channel blocker Discontinue Flexeril that can be proarrhythmic And a vascular screening duplex with her concerns of carotid disease and stroke for strong family history   Next appointment: 6 months   Medication Adjustments/Labs and Tests Ordered: Current medicines are reviewed at length with the patient today.  Concerns regarding medicines are outlined above.  Orders Placed This Encounter  Procedures   EKG 12-Lead   No orders of the defined types were placed in this encounter.    History of Present Illness:    Debra Mendez is a 77 y.o. female with a hx of hypertension short PR interval without preexcitation and paroxysmal atrial tachycardia with a previous coronary artery CTA 2022 with a calcium score of 0 and minimal nonobstructive plaque in the LAD and left circumflex coronary artery last seen 12/26/2022.  She was seen with her PCP 03/14/2023 for atrial fibrillation and she was in sinus rhythm the day of the visit she had an event monitor applied in her PCP office.  Monitor initiated 03/18/2023 for 8 days rhythm throughout with sinus tract episodes of atrial tachycardia longest 22 complexes rate 121 bpm on  review of strips there were no episodes of atrial fibrillation.  In total there were 22 episodes.  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[facility] She tells me about 4 years ago she saw a PCP and [facility] and was told she had atrial fibrillation There is no documentation in the medical records and I reviewed them from both [facility] as well as my practice and I have removed the diagnosis from her problem list When she is under stress she is aware of her heart and she was captured having episodes of atrial tachycardia The symptoms are not severe or sustained Her blood pressure is well-controlled She will initiate a low-dose of a selective beta-blocker to eliminate her arrhythmia She has a Samsung phone I encouraged her to purchase a Samsung watch to monitor her heart rhythm for recurrence She is quite concerned about her risk for stroke and I offered her a vascular screening duplex in our office Past Medical History:  Diagnosis Date   Adjustment disorder    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/2018   Benign paroxysmal vertigo, unspecified ear    Breast mass 06/03/2017   Ectopic atrial tachycardia 02/22/2018   Elevated blood pressure reading in office without diagnosis of hypertension 02/03/2018   Essential (primary) hypertension    Essential hypertension 11/19/2019   Gastroesophageal reflux disease without esophagitis 01/23/2019   Hx of long-term (current) use of postmenopausal hormone replacement therapy 12/13/2016   Impaired fasting blood sugar 12/13/2016   Osteoarthritis of cervical and  lumbar spine 12/13/2016   Osteoporosis, 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   azelastine (ASTELIN) 0.1 % nasal  spray Place 2 sprays into both nostrils as needed for rhinitis or allergies. Use in each nostril as directed   estradiol (ESTRACE) 0.5 MG tablet Take 0.5 mg by mouth daily.   fluticasone (FLONASE) 50 MCG/ACT nasal spray Place 1 spray into both nostrils as needed for allergies or rhinitis.   ibuprofen (ADVIL) 200 MG tablet Take 200 mg by mouth every 8 (eight) hours as needed for headache or mild pain.   ondansetron (ZOFRAN) 4 MG tablet Take 4 mg by mouth every 8 (eight) hours as needed for nausea or vomiting.   Turmeric (QC TUMERIC COMPLEX) 500 MG CAPS Take 500 mg by mouth daily.      EKGs/Labs/Other Studies Reviewed:    The following studies were reviewed today:  Cardiac Studies & Procedures          CT SCANS  CT CORONARY MORPH W/CTA COR W/SCORE 10/01/2020  Addendum 10/01/2020  1:08 PM ADDENDUM REPORT: 10/01/2020 13:06  CLINICAL DATA:  Chest pain  EXAM: Cardiac/Coronary CTA  TECHNIQUE: The patient was scanned on a [employer]. A 110 kV prospective scan was triggered in the descending thoracic aorta at 111 HU's. Axial non-contrast 3 mm slices were carried out through the heart. The data set was analyzed on a dedicated work station and scored using the Agatson method. Gantry rotation speed was 250 msecs and collimation was .6 mm. No beta blockade and 0.8 mg of sl NTG was given. The 3D data set was reconstructed in 5% intervals of the 35-75 % of the R-R cycle. Diastolic phases were analyzed on a dedicated work station using MPR, MIP and VRT modes. The patient received 80 cc of contrast.  FINDINGS: Image quality: excellent.  Noise artifact is: Limited.  Limited cardiac motion artifact.  Coronary Arteries:  Normal coronary origin.  Right dominance.  Left main: The left main is a large caliber vessel with a normal take off from the left coronary cusp that bifurcates to form a left anterior descending artery and a left circumflex artery. There is no plaque or  stenosis.  Left anterior descending artery: The LAD is patent with minimal non-calcified plaque (<25%). The LAD gives off 1 patent diagonal branch.  Left circumflex artery: The LCX is non-dominant and patent with minimal non-calcified plaque (<25%). The LCX gives off 3 patent obtuse marginal branches.  Right coronary artery: The RCA is dominant with normal take off from the right coronary cusp. There is minimal non-calcified plaque (<25%). The RCA terminates as a PDA and right posterolateral branch without evidence of plaque or stenosis.  Right Atrium: Right atrial size is within normal limits.  Right Ventricle: The right ventricular cavity is within normal limits.  Left Atrium: Left atrial size is normal in size with no left atrial appendage filling defect. A small PFO is present.  Left Ventricle: The ventricular cavity size is within normal limits. There are no stigmata of prior infarction. There is no abnormal filling defect.  Pulmonary arteries: Normal in size without proximal filling defect.  Pulmonary veins: Normal pulmonary venous drainage.  Pericardium: Normal thickness with no significant effusion or calcium present.  Cardiac valves: The aortic valve is trileaflet without significant calcification.  The mitral valve is normal structure without significant calcification.  Aorta: Normal caliber with no significant disease.  Extra-cardiac findings: See attached radiology report for non-cardiac structures.  IMPRESSION: 1. Coronary calcium score of 0.  2. Normal coronary origin with right dominance.  3. 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  4. Small PFO.  RECOMMENDATIONS: 1. Minimal non-obstructive CAD (0-24%). Consider non-atherosclerotic causes of chest pain. Consider preventive therapy and risk factor modification.  Lennie Odor, MD   Electronically Signed By: Lennie Odor On: 10/01/2020 13:06  Narrative EXAM: OVER-READ  INTERPRETATION  CT CHEST  The following report is an over-read performed by radiologist Dr. Trudie Reed of [facility] on 10/01/2020. This over-read does not include interpretation of cardiac or coronary anatomy or pathology. The coronary calcium score/coronary CTA interpretation by the cardiologist is attached.  COMPARISON:  None.  FINDINGS: Aortic atherosclerosis. Within the visualized portions of the thorax there are no suspicious appearing pulmonary nodules or masses, there is no acute consolidative airspace disease, no pleural effusions, no pneumothorax and no lymphadenopathy. Visualized portions of the upper abdomen are unremarkable. There are no aggressive appearing lytic or blastic lesions noted in the visualized portions of the skeleton.  IMPRESSION: 1.  Aortic Atherosclerosis (ICD10-I70.0).  Electronically Signed: By: Trudie Reed M.D. On: 10/01/2020 12:14              Recent Labs: No results found for requested labs within last 365 days.  Recent Lipid Panel No results found for: "CHOL", "TRIG", "HDL", "CHOLHDL", "VLDL", "LDLCALC", "LDLDIRECT"  Physical Exam:    VS:  BP 130/70   Pulse 71   Ht 5\' 2"  (1.575 m)   Wt 104 lb 12.8 oz (47.5 kg)   SpO2 97%   BMI 19.17 kg/m     Wt Readings from Last 3 Encounters:  04/11/23 104 lb 12.8 oz (47.5 kg)  03/22/23 105 lb (47.6 kg)  12/25/20 104 lb 9.6 oz (47.4 kg)     GEN:  Well nourished, well developed in no acute distress HEENT: Normal NECK: No JVD; No carotid bruits LYMPHATICS: No lymphadenopathy CARDIAC: RRR, no murmurs, rubs, gallops RESPIRATORY:  Clear to auscultation without rales, wheezing or rhonchi  ABDOMEN: Soft, non-tender, non-distended MUSCULOSKELETAL:  No edema; No deformity  SKIN: Warm and dry NEUROLOGIC:  Alert and oriented x 3 PSYCHIATRIC:  Normal affect    Signed, Norman Herrlich, MD  04/11/2023 10:35 AM    [facility]

## 2023-04-11 ENCOUNTER — Ambulatory Visit: Payer: Medicare HMO | Attending: Cardiology | Admitting: Cardiology

## 2023-04-11 ENCOUNTER — Encounter: Payer: Self-pay | Admitting: Cardiology

## 2023-04-11 VITALS — BP 130/70 | HR 71 | Ht 62.0 in | Wt 104.8 lb

## 2023-04-11 DIAGNOSIS — I4719 Other supraventricular tachycardia: Secondary | ICD-10-CM | POA: Diagnosis not present

## 2023-04-11 DIAGNOSIS — I10 Essential (primary) hypertension: Secondary | ICD-10-CM

## 2023-04-11 DIAGNOSIS — I493 Ventricular premature depolarization: Secondary | ICD-10-CM | POA: Diagnosis not present

## 2023-04-11 MED ORDER — METOPROLOL SUCCINATE ER 25 MG PO TB24: 12.5000 mg | ORAL_TABLET | Freq: Every day | ORAL | 3 refills | Status: DC

## 2023-04-11 NOTE — Patient Instructions (Signed)
Medication Instructions:  Your physician has recommended you make the following change in your medication:   START: Toprol XL 12.5 mg daily  *If you need a refill on your cardiac medications before your next appointment, please call your pharmacy*   Lab Work: None If you have labs (blood work) drawn today and your tests are completely normal, you will receive your results only by: MyChart Message (if you have MyChart) OR A paper copy in the mail If you have any lab test that is abnormal or we need to change your treatment, we will call you to review the results.   Testing/Procedures: Vascuscreen   Follow-Up: At [facility], you and your health needs are our priority.  As part of our continuing mission to provide you with exceptional heart care, we have created designated Provider Care Teams.  These Care Teams include your primary Cardiologist (physician) and Advanced Practice Providers (APPs -  Physician Assistants and Nurse Practitioners) who all work together to provide you with the care you need, when you need it.  We recommend signing up for the patient portal called "MyChart".  Sign up information is provided on this After Visit Summary.  MyChart is used to connect with patients for Virtual Visits (Telemedicine).  Patients are able to view lab/test results, encounter notes, upcoming appointments, etc.  Non-urgent messages can be sent to your provider as well.   To learn more about what you can do with MyChart, go to [email or web address].    Your next appointment:   6 month(s)  Provider:   Norman Herrlich, MD    Other Instructions Do not use Flexeril  Get a Samsung or Fitbit Watch to monitor heart beat.

## 2023-06-27 DIAGNOSIS — H65192 Other acute nonsuppurative otitis media, left ear: Secondary | ICD-10-CM | POA: Diagnosis not present

## 2023-06-27 DIAGNOSIS — R0981 Nasal congestion: Secondary | ICD-10-CM | POA: Diagnosis not present

## 2023-06-27 DIAGNOSIS — Z681 Body mass index (BMI) 19 or less, adult: Secondary | ICD-10-CM | POA: Diagnosis not present

## 2023-08-04 DIAGNOSIS — H8103 Meniere's disease, bilateral: Secondary | ICD-10-CM

## 2023-08-04 DIAGNOSIS — H9122 Sudden idiopathic hearing loss, left ear: Secondary | ICD-10-CM | POA: Diagnosis not present

## 2023-08-04 DIAGNOSIS — H9312 Tinnitus, left ear: Secondary | ICD-10-CM

## 2023-08-04 DIAGNOSIS — H903 Sensorineural hearing loss, bilateral: Secondary | ICD-10-CM | POA: Diagnosis not present

## 2023-08-04 HISTORY — DX: Meniere's disease, bilateral: H81.03

## 2023-08-04 HISTORY — DX: Tinnitus, left ear: H93.12

## 2023-08-09 DIAGNOSIS — H918X3 Other specified hearing loss, bilateral: Secondary | ICD-10-CM | POA: Insufficient documentation

## 2023-08-09 HISTORY — DX: Other specified hearing loss, bilateral: H91.8X3

## 2023-08-15 DIAGNOSIS — H918X3 Other specified hearing loss, bilateral: Secondary | ICD-10-CM | POA: Diagnosis not present

## 2023-08-15 DIAGNOSIS — H9122 Sudden idiopathic hearing loss, left ear: Secondary | ICD-10-CM | POA: Diagnosis not present

## 2023-10-31 ENCOUNTER — Telehealth: Payer: Self-pay | Admitting: Cardiology

## 2023-10-31 NOTE — Telephone Encounter (Signed)
 Pt c/o medication issue:  1. Name of Medication: metoprolol  succinate (TOPROL  XL) 25 MG 24 hr tablet   2. How are you currently taking this medication (dosage and times per day)?    3. Are you having a reaction (difficulty breathing--STAT)? no  4. What is your medication issue? Patient states that she still gets achs in  her chest and it beats real fast. Calling to see what else an be done. Please advise

## 2023-10-31 NOTE — Telephone Encounter (Signed)
 Called patient and she stated that she was having chest pain and her chest was aching all over. The pain was a stabbing pain and the longest it has lasted was for 2 days but recently it has occurred every day for the past week. She also states that it feels like her heart is racing inside of her chest. Based on the symptoms the patient is having I recommended that she go to the ER to be evaluated. Patient verbalized understanding and agreed to go to the ER. Patient had no further questions at this time.

## 2023-11-24 ENCOUNTER — Other Ambulatory Visit: Payer: Self-pay

## 2023-11-24 ENCOUNTER — Telehealth: Payer: Self-pay | Admitting: Cardiology

## 2023-11-24 NOTE — Telephone Encounter (Signed)
 See 2/10 encounter. Patient states she's having aching all over her body, but no chest pain. She says she feels her heart beating at times but whenever I asked about flutters or palpitations she says she isn't having issues.

## 2023-11-24 NOTE — Telephone Encounter (Signed)
 Called patient and she reported that she was feeling achy all over and this was the same feeling she had before when Dr. Dulce Sellar had prescribed her the metoprolol during her last visit. She does not have any chest pain/pressure or discomfort and she is not sick with the flu she just has a generalized achiness all over. She also states that she is very hyper and at work her co-workers ask her to calm down and take a deep breath. Her blood pressure today is 125/60 HR70.

## 2023-11-25 ENCOUNTER — Other Ambulatory Visit: Payer: Self-pay

## 2023-11-25 NOTE — Telephone Encounter (Signed)
 Left message for patient to call back

## 2023-11-25 NOTE — Telephone Encounter (Signed)
 Patient returned RN's call.

## 2023-11-25 NOTE — Telephone Encounter (Signed)
 Called the patient and informed her of Dr. Hulen Shouts recommendation below:  "I think it be quite uncommon for metoprolol to be causing this type of side effect but regardless I think you should stop it certainly would not give you another medication at this time".   Patient verbalized understanding and had no further questions at this time.

## 2023-12-26 NOTE — Telephone Encounter (Signed)
 Appointment made for pt with Dr. Vincent Gros to discuss rapid heart rates and unable to take Metoprolol.

## 2023-12-26 NOTE — Telephone Encounter (Signed)
 Pt calling in regards to this matter again having the same symptoms. Please advise

## 2023-12-29 ENCOUNTER — Ambulatory Visit

## 2023-12-29 VITALS — BP 118/64 | HR 66 | Ht 62.0 in | Wt 109.6 lb

## 2023-12-29 DIAGNOSIS — I4719 Other supraventricular tachycardia: Secondary | ICD-10-CM | POA: Diagnosis not present

## 2023-12-29 DIAGNOSIS — R002 Palpitations: Secondary | ICD-10-CM

## 2023-12-29 DIAGNOSIS — I10 Essential (primary) hypertension: Secondary | ICD-10-CM

## 2023-12-29 NOTE — Assessment & Plan Note (Addendum)
 Short runs of ectopic atrial tachycardia on prior heart monitor in June 2024.  With ongoing symptoms as above with palpitations, will reassess with a heart monitor for 7 days.  At this time since she reports improvement in symptoms and subjective's while on metoprolol, hold off on starting any medications at this time. Advised her to keep herself well-hydrated through the day especially when at work.  Will obtain transthoracic echocardiogram to rule out any significant cardiac structural and functional abnormalities.

## 2023-12-29 NOTE — Assessment & Plan Note (Signed)
 Well-controlled on current regimen with amlodipine 2.5 mg once daily. Target blood pressure below 130/80 mmHg.

## 2023-12-29 NOTE — Patient Instructions (Signed)
 Medication Instructions:  Your physician recommends that you continue on your current medications as directed. Please refer to the Current Medication list given to you today.  *If you need a refill on your cardiac medications before your next appointment, please call your pharmacy*   Lab Work: None Ordered If you have labs (blood work) drawn today and your tests are completely normal, you will receive your results only by: MyChart Message (if you have MyChart) OR A paper copy in the mail If you have any lab test that is abnormal or we need to change your treatment, we will call you to review the results.   Testing/Procedures: A zio monitor was ordered today. It will remain on for 7 days. Remove 01/05/2024. You will then return monitor and event diary in provided box. It takes 1-2 weeks for report to be downloaded and returned to [place]. We will call you with the results. If monitor falls off or has orange flashing light, please call [employer] for further instructions.   Echocardiogram An echocardiogram is a test that uses sound waves (ultrasound) to produce images of the heart. Images from an echocardiogram can provide important information about: Heart size and shape. The size and thickness and movement of your heart's walls. Heart muscle function and strength. Heart valve function or if you have stenosis. Stenosis is when the heart valves are too narrow. If blood is flowing backward through the heart valves (regurgitation). A tumor or infectious growth around the heart valves. Areas of heart muscle that are not working well because of poor blood flow or injury from a heart attack. Aneurysm detection. An aneurysm is a weak or damaged part of an artery wall. The wall bulges out from the normal force of blood pumping through the body. Tell a health care provider about: Any allergies you have. All medicines you are taking, including vitamins, herbs, eye drops, creams, and over-the-counter  medicines. Any blood disorders you have. Any surgeries you have had. Any medical conditions you have. Whether you are pregnant or may be pregnant. What are the risks? Generally, this is a safe test. However, problems may occur, including an allergic reaction to dye (contrast) that may be used during the test. What happens before the test? No specific preparation is needed. You may eat and drink normally. What happens during the test?  You will take off your clothes from the waist up and put on a hospital gown. Electrodes or electrocardiogram (ECG)patches may be placed on your chest. The electrodes or patches are then connected to a device that monitors your heart rate and rhythm. You will lie down on a table for an ultrasound exam. A gel will be applied to your chest to help sound waves pass through your skin. A handheld device, called a transducer, will be pressed against your chest and moved over your heart. The transducer produces sound waves that travel to your heart and bounce back (or "echo" back) to the transducer. These sound waves will be captured in real-time and changed into images of your heart that can be viewed on a video monitor. The images will be recorded on a computer and reviewed by your health care provider. You may be asked to change positions or hold your breath for a short time. This makes it easier to get different views or better views of your heart. In some cases, you may receive contrast through an IV in one of your veins. This can improve the quality of the pictures from your heart.  The procedure may vary among health care providers and hospitals. What can I expect after the test? You may return to your normal, everyday life, including diet, activities, and medicines, unless your health care provider tells you not to do that. Follow these instructions at home: It is up to you to get the results of your test. Ask your health care provider, or the department that is  doing the test, when your results will be ready. Keep all follow-up visits. This is important. Summary An echocardiogram is a test that uses sound waves (ultrasound) to produce images of the heart. Images from an echocardiogram can provide important information about the size and shape of your heart, heart muscle function, heart valve function, and other possible heart problems. You do not need to do anything to prepare before this test. You may eat and drink normally. After the echocardiogram is completed, you may return to your normal, everyday life, unless your health care provider tells you not to do that. This information is not intended to replace advice given to you by your health care provider. Make sure you discuss any questions you have with your health care provider. Document Revised: 05/20/2021 Document Reviewed: 04/29/2020 Elsevier Patient Education  2023 Elsevier Inc.     Follow-Up: At [facility], you and your health needs are our priority.  As part of our continuing mission to provide you with exceptional heart care, we have created designated Provider Care Teams.  These Care Teams include your primary Cardiologist (physician) and Advanced Practice Providers (APPs -  Physician Assistants and Nurse Practitioners) who all work together to provide you with the care you need, when you need it.  We recommend signing up for the patient portal called "MyChart".  Sign up information is provided on this After Visit Summary.  MyChart is used to connect with patients for Virtual Visits (Telemedicine).  Patients are able to view lab/test results, encounter notes, upcoming appointments, etc.  Non-urgent messages can be sent to your provider as well.   To learn more about what you can do with MyChart, go to [email or web address].    Your next appointment:    Based on test results

## 2023-12-29 NOTE — Assessment & Plan Note (Signed)
 Likely related to her short runs of paroxysmal ectopic atrial tachycardia as previously documented. Will reassess with a heart monitor for 7 days. Echocardiogram to rule out any structural and functional abnormalities.

## 2023-12-29 NOTE — Progress Notes (Signed)
 Cardiology Consultation:    Date:  12/29/2023   ID:  Debra Mendez, DOB [birth year], MRN [id number]  PCP:  Mendez, Debra T, MD  Cardiologist:  Debra Corporal Ardith Lewman, MD   Referring MD: Mendez, Debra T, MD   Cardiology Consultation:    Date:  12/29/2023   ID:  Debra Mendez, DOB 11/27/1945, MRN [id number]  PCP:  Mendez, Debra T, MD  Cardiologist:  Debra Corporal Debra Pellecchia, MD   Referring MD: Mendez, Debra T, MD   No chief complaint on file.    ASSESSMENT AND PLAN:   Ms Debra Mendez 78 year old with history of paroxysmal atrial tachycardia, hypertension, short PR interval without evidence of preexcitation on prior EKGs, minimal nonobstructive coronary atherosclerosis on cardiac CT scan from January 2022, here for follow-up regarding symptoms of palpitations which she initially attributed to her low-dose metoprolol, symptoms improved mildly after discontinuing metoprolol but still persist on a daily basis.  Problem List Items Addressed This Visit     Ectopic atrial tachycardia (HCC) - Primary   Short runs of ectopic atrial tachycardia on prior heart monitor in June 2024.  With ongoing symptoms as above with palpitations, will reassess with a heart monitor for 7 days.  At this time since she reports improvement in symptoms and subjective's while on metoprolol, hold off on starting any medications at this time. Advised her to keep herself well-hydrated through the day especially when at work.  Will obtain transthoracic echocardiogram to rule out any significant cardiac structural and functional abnormalities.        Relevant Orders   ECHOCARDIOGRAM COMPLETE   LONG TERM MONITOR (3-14 DAYS)   EKG 12-Lead (Completed)   Essential hypertension   Well-controlled on current regimen with amlodipine 2.5 mg once daily. Target blood pressure below 130/80 mmHg.       Palpitations   Likely related to her short runs of paroxysmal ectopic atrial tachycardia as previously  documented. Will reassess with a heart monitor for 7 days. Echocardiogram to rule out any structural and functional abnormalities.       If any significant increase in ectopic atrial burden, will recommend reassessing thyroid panel and will consider alternative beta-blockers versus calcium channel blocker such as diltiazem in place of amlodipine. Return to clinic tentatively based on test results.   History of Present Illness:    Debra Mendez is a 78 y.o. female who is being seen today for follow-up visit. Last visit with our office was 04/11/2023 with Dr. Dulce Sellar. PCP is Mendez, Debra T, MD. Very pleasant woman here for the visit by herself.  Lives by herself at home.  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  Has history of hypertension, short PR interval without evidence of preexcitation, paroxysmal atrial January tachycardia, minimal nonobstructive plaque and calcium score of 0 on cardiac CT in 2022.  There was question about diagnosis of atrial fibrillation in the past but there was no confirmed documentation with the EKG or telemetry and her heart monitor from 03/18/2023 showed short runs of paroxysmal atrial tachycardia with the longest episode lasting 22 beats.  She reached out to our office for symptoms of chest discomfort associated with fast heartbeat sensation.  She mentions over couple months she has noticed increased symptoms of palpitations that she describes as fast heartbeat sensation mostly towards the evenings and first thing in the mornings.  Later towards the rest of the day she feels fine.  She attributes metoprolol to be the cause of the symptoms and reached  out to our office. We suggested holding of metoprolol which she did about 3 weeks ago and mentions symptoms have improved since then. She however continues to report mild symptoms which occur daily and describes as a fast heartbeat sensation.  Denies any falls, syncopal episodes. Denies any  chest pain, shortness of breath. Mentions blood pressures at home typically well-controlled.  No significantly elevated or low blood pressures.  EKG in the clinic today shows sinus rhythm heart rate 60/min, PR interval 146 ms, QTc 438 ms.  Normal  Past Medical History:  Diagnosis Date   Adjustment disorder    Asymmetrical hearing loss 08/09/2023   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/2018   Atrial fibrillation (HCC)    Benign paroxysmal vertigo, unspecified ear    Breast mass 06/03/2017   Ectopic atrial tachycardia (HCC) 02/22/2018   Elevated blood pressure reading in office without diagnosis of hypertension 02/03/2018   Essential (primary) hypertension    Essential hypertension 11/19/2019   Gastroesophageal reflux disease without esophagitis 01/23/2019   Hx of long-term (current) use of postmenopausal hormone replacement therapy 12/13/2016   Impaired fasting blood sugar 12/13/2016   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   estradiol (ESTRACE) 0.5 MG tablet Take 0.5 mg by mouth daily.   ibuprofen (ADVIL) 200 MG tablet Take 200 mg by mouth every 8 (eight) hours as needed for headache or mild pain.     Allergies:   Patient has no known allergies.   Social History   Socioeconomic History   Marital status: Unknown    Spouse name: Not on file   Number of children: Not on file   Years of education: Not on file   Highest education level: Not on file  Occupational History   Not on file  Tobacco Use   Smoking status: Never   Smokeless  tobacco: Never  Vaping Use   Vaping status: Never Used  Substance and Sexual Activity   Alcohol use: Not Currently   Drug use: Never   Sexual activity: Not Currently  Other Topics Concern   Not on file  Social History Narrative   Not on file   Social Drivers of Health   Financial Resource Strain: Not on file  Food Insecurity: Not on file  Transportation Needs: Not on file  Physical Activity: Not on file  Stress: Not on file  Social Connections: Not on file     Family History: The patient's family history includes Dementia in her mother; Diabetes in her sister; Heart attack in her maternal grandfather; Heart disease in her father; Stroke in her maternal grandmother and mother. ROS:   Please see the history of present illness.    All 14 point review of systems negative except as described per history of present illness.  EKGs/Labs/Other Studies Reviewed:    The following studies were reviewed today:   EKG:  EKG Interpretation Date/Time:  Thursday December 29 2023 10:49:43 EDT Ventricular Rate:  60 PR Interval:  146 QRS Duration:  82 QT Interval:  438  QTC Calculation: 438 R Axis:   77  Text Interpretation: Normal sinus rhythm Normal ECG When compared with ECG of 11-Apr-2023 10:14, No significant change was found Confirmed by Huntley Dec reddy [phone]) on 12/29/2023 11:09:15 AM    Recent Labs: No results found for requested labs within last 365 days.  Recent Lipid Panel No results found for: "CHOL", "TRIG", "HDL", "CHOLHDL", "VLDL", "LDLCALC", "LDLDIRECT"  Physical Exam:    VS:  BP 118/64   Pulse 66   Ht 5\' 2"  (1.575 m)   Wt 109 lb 9.6 oz (49.7 kg)   SpO2 98%   BMI 20.05 kg/m     Wt Readings from Last 3 Encounters:  12/29/23 109 lb 9.6 oz (49.7 kg)  04/11/23 104 lb 12.8 oz (47.5 kg)  03/22/23 105 lb (47.6 kg)     GENERAL:  Well nourished, well developed in no acute distress NECK: No JVD; No carotid bruits CARDIAC: RRR, S1 and S2 present, no murmurs,  no rubs, no gallops CHEST:  Clear to auscultation without rales, wheezing or rhonchi  Extremities: No pitting pedal edema. Pulses bilaterally symmetric with radial 2+ and dorsalis pedis 2+ NEUROLOGIC:  Alert and oriented x 3  Medication Adjustments/Labs and Tests Ordered: Current medicines are reviewed at length with the patient today.  Concerns regarding medicines are outlined above.  Orders Placed This Encounter  Procedures   LONG TERM MONITOR (3-14 DAYS)   EKG 12-Lead   ECHOCARDIOGRAM COMPLETE   No orders of the defined types were placed in this encounter.   Signed, Aanshi Batchelder reddy Skylen Spiering, MD, MPH, [facility]. 12/29/2023 11:09 AM    [facility]

## 2024-01-12 DIAGNOSIS — I4719 Other supraventricular tachycardia: Secondary | ICD-10-CM | POA: Diagnosis not present

## 2024-01-18 ENCOUNTER — Telehealth: Payer: Self-pay

## 2024-01-18 NOTE — Telephone Encounter (Signed)
 Patient is requesting to speak with a nurse in regard to a heart monitor. Please advise.

## 2024-01-18 NOTE — Telephone Encounter (Signed)
 Spoke with pt and advised that the EKG done in the office was normal. Advised that we are waiting on Dr. Ronell Coe to review monitor results and make a recommendation. Pt verbalized understanding and had no additional questions.

## 2024-01-25 ENCOUNTER — Ambulatory Visit

## 2024-01-25 DIAGNOSIS — I4719 Other supraventricular tachycardia: Secondary | ICD-10-CM | POA: Diagnosis not present

## 2024-01-25 LAB — ECHOCARDIOGRAM COMPLETE
Area-P 1/2: 3.48 cm2
S' Lateral: 2.5 cm

## 2024-03-07 LAB — LAB REPORT - SCANNED

## 2024-04-16 ENCOUNTER — Ambulatory Visit: Admitting: Cardiology

## 2024-05-29 DIAGNOSIS — J329 Chronic sinusitis, unspecified: Secondary | ICD-10-CM | POA: Diagnosis not present

## 2024-05-29 DIAGNOSIS — J40 Bronchitis, not specified as acute or chronic: Secondary | ICD-10-CM | POA: Diagnosis not present

## 2024-06-11 NOTE — Progress Notes (Unsigned)
 Cardiology Office Note:    Date:  06/13/2024   ID:  Debra Mendez, DOB Jun 08, 1946, MRN [id number]  PCP:  Clemmie Nest, MD  Cardiologist:  Redell Leiter, MD    Referring MD: Clemmie Nest, MD    ASSESSMENT:    1. Ectopic atrial tachycardia   2. Essential hypertension   3. PVC's (premature ventricular contractions)    PLAN:    In order of problems listed above:  She is quite bothered by palpitation I think it is beneficial to restart a beta-blocker she said it had worked in the past I will let her decide whether she just takes it in the evening or in the morning Follow-up with her PCP regarding sleep Hyper tension is well-controlled Beta-blocker should also be helpful for symptomatic PVCs Echocardiogram is normal for her age   Next appointment: 6 months   Medication Adjustments/Labs and Tests Ordered: Current medicines are reviewed at length with the patient today.  Concerns regarding medicines are outlined above.  No orders of the defined types were placed in this encounter.  No orders of the defined types were placed in this encounter.    History of Present Illness:    Debra Mendez is a 78 y.o. female with a hx of ectopic atrial tachycardia hypertension accelerated AV nodal conduction without preexcitation and previous coronary artery CTA with a calcium score of 0 and minimal nonobstructive plaque noted in the LAD and left circumflex coronary artery last seen 04/11/2023.  She was seen in April by my partner with ongoing symptoms of palpitation.  An echocardiogram was performed showing the left ventricle to be normal in size EF 60 to 65% GLS and diastolic function.  There is no other valvular abnormality noted.  She had an event monitor performed for 1025 for 10 days there is no official report she had symptomatic events all sinus rhythm and associated with frequent PVCs.  Supraventricular ectopy was rare ventricular ectopy was occasional and there were no  episodes of atrial fibrillation or flutter.  Brief episode of atrial tachycardia was noted.  Recent labs 03/07/2024 CBC normal 8.6 platelets 221,000 cholesterol 233 LDL 113 non-HDL cholesterol 176 in October CMP in June was normal including liver function test.  Compliance with diet, lifestyle and medications: Yes  She is here for 2 reasons 1 is she is particularly bothered in the evening with her heart beating and it feels rapid would like to restart rate suppressing medication will use Sectral  200 mg prescription is for twice daily but she can take it only in the evenings if she prefers Second as she is concerned that she never had communication about her echocardiogram that was performed in the office in May.  I stopped all is doing came out and look left ventricle is normal in size GLS was normal right ventricle is normal and no significant valvular disease  She has tried medications in the past approaches May about sleep medicines I told her it is outside the scope of my practice  She is not having syncope chest pain edema or shortness of breath  Past Medical History:  Diagnosis Date   Adjustment disorder    Asymmetrical hearing loss 08/09/2023   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/2018   Atrial fibrillation (HCC)    Benign paroxysmal vertigo, unspecified ear    Breast mass 06/03/2017   Ectopic atrial tachycardia 02/22/2018   Elevated blood pressure reading in office without diagnosis of hypertension 02/03/2018   Essential (primary) hypertension    Essential hypertension 11/19/2019  Gastroesophageal reflux disease without esophagitis 01/23/2019   Hx of long-term (current) use of postmenopausal hormone replacement therapy 12/13/2016   Impaired fasting blood sugar 12/13/2016   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   Azelastine HCl 137 MCG/SPRAY SOLN Place 2 sprays into both nostrils daily.   estradiol (ESTRACE) 0.5 MG tablet Take 0.5 mg by mouth daily.   ibuprofen (ADVIL) 200 MG tablet Take 200 mg by mouth every 8 (eight) hours as needed for headache or mild pain.   omeprazole (PRILOSEC) 40 MG capsule Take 40 mg by mouth daily.      EKGs/Labs/Other Studies Reviewed:    The following studies were reviewed today:  Cardiac Studies & Procedures   ______________________________________________________________________________________________     ECHOCARDIOGRAM  ECHOCARDIOGRAM COMPLETE 01/25/2024  Narrative ECHOCARDIOGRAM REPORT    Patient Name:   Debra Mendez Date of Exam: 01/25/2024 Medical Rec #:  [id number]       Height:       62.0 in Accession #:    [id number]      Weight:       109.6 lb Date of Birth:  May 03, 1946      BSA:          1.480 m Patient Age:    77 years        BP:           118/64 mmHg Patient Gender: F               HR:           75 bpm. Exam Location:  [place]  Procedure: 2D Echo, Cardiac Doppler, Color Doppler and Strain Analysis (Both Spectral and Color Flow Doppler were utilized during procedure).  Indications:    Ectopic atrial tachycardia (HCC) [I47.19 (ICD-10-CM)]  History:        Patient has no prior history of Echocardiogram examinations. Arrythmias:Atrial Fibrillation; Risk Factors:Hypertension. Palpitations.  Sonographer:    Charlie Jointer RDCS Referring Phys: [id number] ALEAN SAUNDERS MADIREDDY  IMPRESSIONS   1. Left ventricular ejection fraction, by estimation, is 60 to 65%. The left ventricle has normal function. The left ventricle has no regional wall motion abnormalities. There is mild asymmetric left ventricular hypertrophy of the basal-septal segment. Left ventricular diastolic parameters  were normal. The average left ventricular global longitudinal strain is -19.1 %. The global longitudinal strain is normal. 2. Right ventricular systolic function is normal. The right ventricular size is normal. There is normal pulmonary artery systolic pressure. 3. The mitral valve is normal in structure. Mild mitral valve regurgitation. No evidence of mitral stenosis. 4. The aortic valve is normal in structure. Aortic valve regurgitation is not visualized. Aortic valve sclerosis is present, with no evidence of aortic valve stenosis. 5. The inferior vena cava is normal in size with greater than 50% respiratory variability, suggesting right atrial pressure of 3 mmHg.  FINDINGS Left Ventricle: Left ventricular ejection fraction, by estimation, is 60 to 65%. The left ventricle has normal function. The left ventricle has no regional wall motion abnormalities. The average left ventricular global longitudinal strain is -19.1 %. Strain was performed and the global longitudinal strain is normal. The left ventricular  internal cavity size was normal in size. There is mild asymmetric left ventricular hypertrophy of the basal-septal segment. Left ventricular diastolic parameters were normal.  Right Ventricle: The right ventricular size is normal. No increase in right ventricular wall thickness. Right ventricular systolic function is normal. There is normal pulmonary artery systolic pressure. The tricuspid regurgitant velocity is 2.53 m/s, and with an assumed right atrial pressure of 3 mmHg, the estimated right ventricular systolic pressure is 28.6 mmHg.  Left Atrium: Left atrial size was normal in size.  Right Atrium: Right atrial size was normal in size.  Pericardium: There is no evidence of pericardial effusion.  Mitral Valve: The mitral valve is normal in structure. Mild mitral valve regurgitation. No evidence of mitral valve stenosis.  Tricuspid Valve: The tricuspid valve is normal in structure.  Tricuspid valve regurgitation is mild . No evidence of tricuspid stenosis.  Aortic Valve: The aortic valve is normal in structure. Aortic valve regurgitation is not visualized. Aortic valve sclerosis is present, with no evidence of aortic valve stenosis.  Pulmonic Valve: The pulmonic valve was normal in structure. Pulmonic valve regurgitation is not visualized. No evidence of pulmonic stenosis.  Aorta: The aortic root is normal in size and structure.  Venous: The inferior vena cava is normal in size with greater than 50% respiratory variability, suggesting right atrial pressure of 3 mmHg.  IAS/Shunts: No atrial level shunt detected by color flow Doppler.   LEFT VENTRICLE PLAX 2D LVIDd:         3.80 cm   Diastology LVIDs:         2.50 cm   LV e' medial:    10.70 cm/s LV PW:         0.70 cm   LV E/e' medial:  10.9 LV IVS:        1.20 cm   LV e' lateral:   9.36 cm/s LVOT diam:     1.80 cm   LV E/e' lateral: 12.5 LV SV:         76 LV SV Index:   51        2D Longitudinal Strain LVOT Area:     2.54 cm  2D Strain GLS Avg:     -19.1 %   RIGHT VENTRICLE             IVC RV Basal diam:  3.40 cm     IVC diam: 1.30 cm RV Mid diam:    3.20 cm RV S prime:     13.20 cm/s TAPSE (M-mode): 2.7 cm  LEFT ATRIUM             Index        RIGHT ATRIUM           Index LA diam:        2.60 cm 1.76 cm/m   RA Area:     10.60 cm LA Vol (A2C):   25.5 ml 17.22 ml/m  RA Volume:   21.60 ml  14.59 ml/m LA Vol (A4C):   30.8 ml 20.80 ml/m LA Biplane Vol: 28.9 ml 19.52 ml/m AORTIC VALVE LVOT Vmax:   130.50 cm/s LVOT Vmean:  86.225 cm/s LVOT VTI:    0.298 m  AORTA Ao Root diam: 2.80 cm Ao Asc diam:  3.10 cm Ao Desc diam: 1.80 cm  MITRAL VALVE                TRICUSPID VALVE MV Area (PHT): 3.48 cm     TR Peak grad:  25.6 mmHg MV Decel Time: 218 msec     TR Vmax:        253.00 cm/s MV E velocity: 117.00 cm/s MV A velocity: 86.80 cm/s   SHUNTS MV E/A ratio:  1.35         Systemic VTI:  0.30  m Systemic Diam: 1.80 cm  Lamar Fitch MD Electronically signed by Lamar Fitch MD Signature Date/Time: 01/25/2024/5:09:39 PM    Final      CT SCANS  CT CORONARY MORPH W/CTA COR W/SCORE 10/01/2020  Addendum 10/01/2020  1:08 PM ADDENDUM REPORT: 10/01/2020 13:06  CLINICAL DATA:  Chest pain  EXAM: Cardiac/Coronary CTA  TECHNIQUE: The patient was scanned on a [employer]. A 110 kV prospective scan was triggered in the descending thoracic aorta at 111 HU's. Axial non-contrast 3 mm slices were carried out through the heart. The data set was analyzed on a dedicated work station and scored using the Agatson method. Gantry rotation speed was 250 msecs and collimation was .6 mm. No beta blockade and 0.8 mg of sl NTG was given. The 3D data set was reconstructed in 5% intervals of the 35-75 % of the R-R cycle. Diastolic phases were analyzed on a dedicated work station using MPR, MIP and VRT modes. The patient received 80 cc of contrast.  FINDINGS: Image quality: excellent.  Noise artifact is: Limited.  Limited cardiac motion artifact.  Coronary Arteries:  Normal coronary origin.  Right dominance.  Left main: The left main is a large caliber vessel with a normal take off from the left coronary cusp that bifurcates to form a left anterior descending artery and a left circumflex artery. There is no plaque or stenosis.  Left anterior descending artery: The LAD is patent with minimal non-calcified plaque (<25%). The LAD gives off 1 patent diagonal branch.  Left circumflex artery: The LCX is non-dominant and patent with minimal non-calcified plaque (<25%). The LCX gives off 3 patent obtuse marginal branches.  Right coronary artery: The RCA is dominant with normal take off from the right coronary cusp. There is minimal non-calcified plaque (<25%). The RCA terminates as a PDA and right posterolateral branch without evidence of plaque or stenosis.  Right  Atrium: Right atrial size is within normal limits.  Right Ventricle: The right ventricular cavity is within normal limits.  Left Atrium: Left atrial size is normal in size with no left atrial appendage filling defect. A small PFO is present.  Left Ventricle: The ventricular cavity size is within normal limits. There are no stigmata of prior infarction. There is no abnormal filling defect.  Pulmonary arteries: Normal in size without proximal filling defect.  Pulmonary veins: Normal pulmonary venous drainage.  Pericardium: Normal thickness with no significant effusion or calcium present.  Cardiac valves: The aortic valve is trileaflet without significant calcification. The mitral valve is normal structure without significant calcification.  Aorta: Normal caliber with no significant disease.  Extra-cardiac findings: See attached radiology report for non-cardiac structures.  IMPRESSION: 1. Coronary calcium score of 0.  2. Normal coronary origin with right dominance.  3. 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  4. Small PFO.  RECOMMENDATIONS: 1. Minimal non-obstructive CAD (0-24%). Consider non-atherosclerotic causes of chest pain. Consider preventive therapy and risk factor modification.  Darryle Decent, MD   Electronically Signed By: Darryle Decent On: 10/01/2020 13:06  Narrative EXAM: OVER-READ INTERPRETATION  CT CHEST  The following report is an over-read performed by radiologist Dr. Toribio Aye of [facility] on  10/01/2020. This over-read does not include interpretation of cardiac or coronary anatomy or pathology. The coronary calcium score/coronary CTA interpretation by the cardiologist is attached.  COMPARISON:  None.  FINDINGS: Aortic atherosclerosis. Within the visualized portions of the thorax there are no suspicious appearing pulmonary nodules or masses, there is no acute consolidative airspace disease, no pleural  effusions, no pneumothorax and no lymphadenopathy. Visualized portions of the upper abdomen are unremarkable. There are no aggressive appearing lytic or blastic lesions noted in the visualized portions of the skeleton.  IMPRESSION: 1.  Aortic Atherosclerosis (ICD10-I70.0).  Electronically Signed: By: Toribio Aye M.D. On: 10/01/2020 12:14     ______________________________________________________________________________________________       Physical Exam:    VS:  BP (!) 120/40   Pulse 64   Ht 5' 2 (1.575 m)   Wt 105 lb 6.4 oz (47.8 kg)   SpO2 99%   BMI 19.28 kg/m     Wt Readings from Last 3 Encounters:  06/13/24 105 lb 6.4 oz (47.8 kg)  12/29/23 109 lb 9.6 oz (49.7 kg)  04/11/23 104 lb 12.8 oz (47.5 kg)     GEN:  Well nourished, well developed in no acute distress HEENT: Normal NECK: No JVD; No carotid bruits LYMPHATICS: No lymphadenopathy CARDIAC: RRR, no murmurs, rubs, gallops RESPIRATORY:  Clear to auscultation without rales, wheezing or rhonchi  ABDOMEN: Soft, non-tender, non-distended MUSCULOSKELETAL:  No edema; No deformity  SKIN: Warm and dry NEUROLOGIC:  Alert and oriented x 3 PSYCHIATRIC:  Normal affect    Signed, Redell Leiter, MD  06/13/2024 10:34 AM    [facility]

## 2024-06-12 DIAGNOSIS — Z1231 Encounter for screening mammogram for malignant neoplasm of breast: Secondary | ICD-10-CM | POA: Diagnosis not present

## 2024-06-12 DIAGNOSIS — I4891 Unspecified atrial fibrillation: Secondary | ICD-10-CM | POA: Insufficient documentation

## 2024-06-13 ENCOUNTER — Encounter: Payer: Self-pay | Admitting: Cardiology

## 2024-06-13 ENCOUNTER — Ambulatory Visit: Attending: Cardiology | Admitting: Cardiology

## 2024-06-13 VITALS — BP 120/40 | HR 64 | Ht 62.0 in | Wt 105.4 lb

## 2024-06-13 DIAGNOSIS — I4719 Other supraventricular tachycardia: Secondary | ICD-10-CM | POA: Diagnosis not present

## 2024-06-13 DIAGNOSIS — I493 Ventricular premature depolarization: Secondary | ICD-10-CM | POA: Diagnosis not present

## 2024-06-13 DIAGNOSIS — I10 Essential (primary) hypertension: Secondary | ICD-10-CM | POA: Diagnosis not present

## 2024-06-13 DIAGNOSIS — Z1231 Encounter for screening mammogram for malignant neoplasm of breast: Secondary | ICD-10-CM | POA: Diagnosis not present

## 2024-06-13 MED ORDER — ACEBUTOLOL HCL 200 MG PO CAPS: 200.0000 mg | ORAL_CAPSULE | Freq: Two times a day (BID) | ORAL | 3 refills | Status: AC

## 2024-06-13 NOTE — Patient Instructions (Signed)
 Medication Instructions:  Your physician has recommended you make the following change in your medication:   START: Acebutolol  200 mg two times daily (You can omit the AM dose if you prefer.)   *If you need a refill on your cardiac medications before your next appointment, please call your pharmacy*  Lab Work: None If you have labs (blood work) drawn today and your tests are completely normal, you will receive your results only by: MyChart Message (if you have MyChart) OR A paper copy in the mail If you have any lab test that is abnormal or we need to change your treatment, we will call you to review the results.  Testing/Procedures: None  Follow-Up: At [facility], you and your health needs are our priority.  As part of our continuing mission to provide you with exceptional heart care, our providers are all part of one team.  This team includes your primary Cardiologist (physician) and Advanced Practice Providers or APPs (Physician Assistants and Nurse Practitioners) who all work together to provide you with the care you need, when you need it.  Your next appointment:   1 year(s)  Provider:   Redell Leiter, MD    We recommend signing up for the patient portal called MyChart.  Sign up information is provided on this After Visit Summary.  MyChart is used to connect with patients for Virtual Visits (Telemedicine).  Patients are able to view lab/test results, encounter notes, upcoming appointments, etc.  Non-urgent messages can be sent to your provider as well.   To learn more about what you can do with MyChart, go to [email or web address].   Other Instructions None

## 2024-06-13 NOTE — Addendum Note (Signed)
 Addended by: SHERRE ADE I on: 06/13/2024 11:16 AM   Modules accepted: Orders

## 2024-06-14 ENCOUNTER — Ambulatory Visit: Payer: Self-pay

## 2024-07-12 ENCOUNTER — Encounter (INDEPENDENT_AMBULATORY_CARE_PROVIDER_SITE_OTHER): Payer: Self-pay

## 2024-08-29 DIAGNOSIS — Z0100 Encounter for examination of eyes and vision without abnormal findings: Secondary | ICD-10-CM | POA: Diagnosis not present

## 2024-10-22 NOTE — Progress Notes (Signed)
 Debra Mendez                                          MRN: [id number]   10/22/2024   The [facility]I Quality Team Specialist reviewed this patient medical record for the purposes of chart review for care gap closure. The following were reviewed: abstraction for care gap closure-controlling blood pressure.    [facility]BCI Quality Team

## 2024-11-06 ENCOUNTER — Institutional Professional Consult (permissible substitution) (INDEPENDENT_AMBULATORY_CARE_PROVIDER_SITE_OTHER): Admitting: Otolaryngology
